# Patient Record
Sex: Female | Born: 1975 | Race: White | Hispanic: No | Marital: Married | Smoking: Never smoker
Health system: Southern US, Community
[De-identification: ages and names within clinical notes are randomized; demographics above are authoritative.]

## PROBLEM LIST (undated history)

## (undated) DIAGNOSIS — F419 Anxiety disorder, unspecified: Secondary | ICD-10-CM

## (undated) DIAGNOSIS — K52832 Lymphocytic colitis: Secondary | ICD-10-CM

## (undated) HISTORY — DX: Lymphocytic colitis: K52.832

## (undated) HISTORY — PX: ANKLE FRACTURE SURGERY: SHX122

## (undated) HISTORY — DX: Anxiety disorder, unspecified: F41.9

---

## 1998-07-29 ENCOUNTER — Other Ambulatory Visit: Admission: RE | Admit: 1998-07-29 | Discharge: 1998-07-29 | Payer: Self-pay | Admitting: Obstetrics and Gynecology

## 1999-03-28 ENCOUNTER — Emergency Department (HOSPITAL_COMMUNITY): Admission: EM | Admit: 1999-03-28 | Discharge: 1999-03-28 | Payer: Self-pay

## 1999-08-18 ENCOUNTER — Other Ambulatory Visit: Admission: RE | Admit: 1999-08-18 | Discharge: 1999-08-18 | Payer: Self-pay | Admitting: Obstetrics and Gynecology

## 1999-11-01 ENCOUNTER — Emergency Department (HOSPITAL_COMMUNITY): Admission: EM | Admit: 1999-11-01 | Discharge: 1999-11-01 | Payer: Self-pay

## 2000-08-26 ENCOUNTER — Other Ambulatory Visit: Admission: RE | Admit: 2000-08-26 | Discharge: 2000-08-26 | Payer: Self-pay | Admitting: Obstetrics and Gynecology

## 2001-09-20 ENCOUNTER — Other Ambulatory Visit: Admission: RE | Admit: 2001-09-20 | Discharge: 2001-09-20 | Payer: Self-pay | Admitting: Obstetrics and Gynecology

## 2002-01-23 ENCOUNTER — Other Ambulatory Visit: Admission: RE | Admit: 2002-01-23 | Discharge: 2002-01-23 | Payer: Self-pay | Admitting: Obstetrics and Gynecology

## 2002-06-23 ENCOUNTER — Emergency Department (HOSPITAL_COMMUNITY): Admission: EM | Admit: 2002-06-23 | Discharge: 2002-06-23 | Payer: Self-pay | Admitting: Emergency Medicine

## 2002-07-10 ENCOUNTER — Other Ambulatory Visit: Admission: RE | Admit: 2002-07-10 | Discharge: 2002-07-10 | Payer: Self-pay | Admitting: Obstetrics and Gynecology

## 2003-08-08 ENCOUNTER — Other Ambulatory Visit: Admission: RE | Admit: 2003-08-08 | Discharge: 2003-08-08 | Payer: Self-pay | Admitting: Obstetrics and Gynecology

## 2004-08-12 ENCOUNTER — Other Ambulatory Visit: Admission: RE | Admit: 2004-08-12 | Discharge: 2004-08-12 | Payer: Self-pay | Admitting: Obstetrics and Gynecology

## 2005-01-21 ENCOUNTER — Emergency Department (HOSPITAL_COMMUNITY): Admission: EM | Admit: 2005-01-21 | Discharge: 2005-01-21 | Payer: Self-pay | Admitting: Emergency Medicine

## 2005-02-08 ENCOUNTER — Emergency Department (HOSPITAL_COMMUNITY): Admission: EM | Admit: 2005-02-08 | Discharge: 2005-02-08 | Payer: Self-pay | Admitting: Emergency Medicine

## 2005-08-19 ENCOUNTER — Ambulatory Visit: Payer: Self-pay | Admitting: Gastroenterology

## 2005-10-19 ENCOUNTER — Ambulatory Visit: Payer: Self-pay | Admitting: Gastroenterology

## 2005-11-01 ENCOUNTER — Ambulatory Visit: Payer: Self-pay | Admitting: Gastroenterology

## 2005-11-01 ENCOUNTER — Encounter (INDEPENDENT_AMBULATORY_CARE_PROVIDER_SITE_OTHER): Payer: Self-pay | Admitting: Specialist

## 2006-01-11 ENCOUNTER — Ambulatory Visit: Payer: Self-pay | Admitting: Gastroenterology

## 2006-11-22 ENCOUNTER — Ambulatory Visit: Payer: Self-pay | Admitting: Family Medicine

## 2006-11-22 LAB — CONVERTED CEMR LAB
ALT: 13 units/L (ref 0–40)
AST: 23 units/L (ref 0–37)
Albumin: 4.4 g/dL (ref 3.5–5.2)
Alkaline Phosphatase: 57 units/L (ref 39–117)
BUN: 9 mg/dL (ref 6–23)
Basophils Absolute: 0 10*3/uL (ref 0.0–0.1)
Basophils Relative: 0.3 % (ref 0.0–1.0)
Bilirubin, Direct: 0.1 mg/dL (ref 0.0–0.3)
CO2: 25 meq/L (ref 19–32)
Calcium: 9.6 mg/dL (ref 8.4–10.5)
Chloride: 106 meq/L (ref 96–112)
Cholesterol: 168 mg/dL (ref 0–200)
Creatinine, Ser: 0.8 mg/dL (ref 0.4–1.2)
Eosinophils Absolute: 0.1 10*3/uL (ref 0.0–0.6)
Eosinophils Relative: 1.3 % (ref 0.0–5.0)
GFR calc Af Amer: 109 mL/min
GFR calc non Af Amer: 90 mL/min
Glucose, Bld: 75 mg/dL (ref 70–99)
HCT: 41.5 % (ref 36.0–46.0)
HDL: 58.3 mg/dL (ref >39.0)
Hemoglobin: 14.4 g/dL (ref 12.0–15.0)
LDL Cholesterol: 101 mg/dL — ABNORMAL HIGH (ref 0–99)
Lymphocytes Relative: 24.6 % (ref 12.0–46.0)
MCHC: 34.7 g/dL (ref 30.0–36.0)
MCV: 91 fL (ref 78.0–100.0)
Monocytes Absolute: 0.4 10*3/uL (ref 0.2–0.7)
Monocytes Relative: 5.5 % (ref 3.0–11.0)
Neutro Abs: 4.7 10*3/uL (ref 1.4–7.7)
Neutrophils Relative %: 68.3 % (ref 43.0–77.0)
Platelets: 271 10*3/uL (ref 150–400)
Potassium: 4.7 meq/L (ref 3.5–5.1)
RBC: 4.56 M/uL (ref 3.87–5.11)
RDW: 13 % (ref 11.5–14.6)
Sodium: 138 meq/L (ref 135–145)
TSH: 1.06 microintl units/mL (ref 0.35–5.50)
Total Bilirubin: 0.8 mg/dL (ref 0.3–1.2)
Total CHOL/HDL Ratio: 2.9
Total Protein: 6.9 g/dL (ref 6.0–8.3)
Triglycerides: 43 mg/dL (ref 0–149)
VLDL: 9 mg/dL (ref 0–40)
WBC: 6.9 10*3/uL (ref 4.5–10.5)

## 2007-01-02 ENCOUNTER — Telehealth (INDEPENDENT_AMBULATORY_CARE_PROVIDER_SITE_OTHER): Payer: Self-pay | Admitting: *Deleted

## 2007-01-31 ENCOUNTER — Emergency Department (HOSPITAL_COMMUNITY): Admission: EM | Admit: 2007-01-31 | Discharge: 2007-01-31 | Payer: Self-pay | Admitting: Emergency Medicine

## 2008-04-15 ENCOUNTER — Ambulatory Visit: Payer: Self-pay | Admitting: Family Medicine

## 2008-04-15 LAB — CONVERTED CEMR LAB
Inflenza A Ag: NEGATIVE
Influenza B Ag: NEGATIVE
Rapid Strep: NEGATIVE

## 2008-04-22 ENCOUNTER — Telehealth (INDEPENDENT_AMBULATORY_CARE_PROVIDER_SITE_OTHER): Payer: Self-pay | Admitting: *Deleted

## 2008-10-04 ENCOUNTER — Emergency Department (HOSPITAL_COMMUNITY): Admission: EM | Admit: 2008-10-04 | Discharge: 2008-10-04 | Payer: Self-pay | Admitting: Neurology

## 2009-07-20 ENCOUNTER — Emergency Department (HOSPITAL_COMMUNITY): Admission: EM | Admit: 2009-07-20 | Discharge: 2009-07-20 | Payer: Self-pay | Admitting: Emergency Medicine

## 2009-10-28 LAB — CONVERTED CEMR LAB: Pap Smear: NORMAL

## 2009-11-20 ENCOUNTER — Ambulatory Visit: Payer: Self-pay | Admitting: Family Medicine

## 2009-11-20 ENCOUNTER — Encounter (INDEPENDENT_AMBULATORY_CARE_PROVIDER_SITE_OTHER): Payer: Self-pay | Admitting: *Deleted

## 2009-11-20 DIAGNOSIS — D239 Other benign neoplasm of skin, unspecified: Secondary | ICD-10-CM | POA: Insufficient documentation

## 2009-11-21 LAB — CONVERTED CEMR LAB
ALT: 9 units/L (ref 0–35)
AST: 20 units/L (ref 0–37)
Albumin: 4.3 g/dL (ref 3.5–5.2)
Alkaline Phosphatase: 51 units/L (ref 39–117)
BUN: 11 mg/dL (ref 6–23)
Basophils Absolute: 0 10*3/uL (ref 0.0–0.1)
Basophils Relative: 0.6 % (ref 0.0–3.0)
Bilirubin, Direct: 0.1 mg/dL (ref 0.0–0.3)
CO2: 28 meq/L (ref 19–32)
Calcium: 9.3 mg/dL (ref 8.4–10.5)
Chloride: 106 meq/L (ref 96–112)
Cholesterol: 161 mg/dL (ref 0–200)
Creatinine, Ser: 0.6 mg/dL (ref 0.4–1.2)
Eosinophils Absolute: 0.2 10*3/uL (ref 0.0–0.7)
Eosinophils Relative: 3.9 % (ref 0.0–5.0)
GFR calc non Af Amer: 113.97 mL/min (ref >60)
Glucose, Bld: 71 mg/dL (ref 70–99)
HCT: 39.4 % (ref 36.0–46.0)
HDL: 68.4 mg/dL (ref >39.00)
Hemoglobin: 13.5 g/dL (ref 12.0–15.0)
LDL Cholesterol: 85 mg/dL (ref 0–99)
Lymphocytes Relative: 26.8 % (ref 12.0–46.0)
Lymphs Abs: 1.7 10*3/uL (ref 0.7–4.0)
MCHC: 34.3 g/dL (ref 30.0–36.0)
MCV: 93.3 fL (ref 78.0–100.0)
Monocytes Absolute: 0.6 10*3/uL (ref 0.1–1.0)
Monocytes Relative: 8.6 % (ref 3.0–12.0)
Neutro Abs: 3.9 10*3/uL (ref 1.4–7.7)
Neutrophils Relative %: 60.1 % (ref 43.0–77.0)
Platelets: 296 10*3/uL (ref 150.0–400.0)
Potassium: 4.1 meq/L (ref 3.5–5.1)
RBC: 4.22 M/uL (ref 3.87–5.11)
RDW: 13.8 % (ref 11.5–14.6)
Sodium: 140 meq/L (ref 135–145)
TSH: 0.85 microintl units/mL (ref 0.35–5.50)
Total Bilirubin: 0.5 mg/dL (ref 0.3–1.2)
Total CHOL/HDL Ratio: 2
Total Protein: 6.7 g/dL (ref 6.0–8.3)
Triglycerides: 37 mg/dL (ref 0.0–149.0)
VLDL: 7.4 mg/dL (ref 0.0–40.0)
WBC: 6.4 10*3/uL (ref 4.5–10.5)

## 2010-02-10 ENCOUNTER — Ambulatory Visit: Payer: Self-pay | Admitting: Family Medicine

## 2010-02-10 ENCOUNTER — Telehealth (INDEPENDENT_AMBULATORY_CARE_PROVIDER_SITE_OTHER): Payer: Self-pay | Admitting: *Deleted

## 2010-02-10 ENCOUNTER — Ambulatory Visit: Payer: Self-pay | Admitting: Cardiology

## 2010-02-10 DIAGNOSIS — R51 Headache: Secondary | ICD-10-CM

## 2010-02-10 DIAGNOSIS — R519 Headache, unspecified: Secondary | ICD-10-CM | POA: Insufficient documentation

## 2010-07-19 ENCOUNTER — Encounter: Payer: Self-pay | Admitting: Obstetrics and Gynecology

## 2010-07-28 NOTE — Assessment & Plan Note (Signed)
Summary: CPX & LAB/CBS   Vital Signs:  Patient profile:   35 year old female Height:      67 inches Weight:      159 pounds BMI:     24.99 Pulse rate:   78 / minute Pulse rhythm:   regular BP sitting:   112 / 72  (left arm) Cuff size:   regular  Vitals Entered By: Army Fossa CMA (Nov 20, 2009 9:09 AM) CC: Pt here for CPX, pt is fasting, no pap   History of Present Illness: Pt here for cpe and labs.   Pt sees Dr Dareen Piano for gyn exam.  No complaints.    Preventive Screening-Counseling & Management  Alcohol-Tobacco     Alcohol drinks/day: 0     Smoking Status: quit > 6 months     Smoke Cessation Stage: quit     Packs/Day: 1.0     Year Started: 1999     Year Quit: 2002  Caffeine-Diet-Exercise     Caffeine use/day: 2     Does Patient Exercise: yes     Type of exercise: bike, treadmill, weights     Exercise (avg: min/session): 30-60     Times/week: 3     Exercise Counseling: not indicated; exercise is adequate  Hep-HIV-STD-Contraception     Dental Visit-last 6 months yes     Dental Care Counseling: not indicated; dental care within six months     SBE monthly: yes     SBE Education/Counseling: not indicated; SBE done regularly      Sexual History:  currently monogamous and married.        Drug Use:  no.    Current Medications (verified): 1)  None  Allergies: 1)  ! Sulfa  Past History:  Past Medical History: Anxiety lymphocytic colitis--patterson  Past Surgical History: Denies surgical history  Family History: Reviewed history from 11/23/2006 and no changes required. Family History of Arthritis Family History Diabetes 1st degree relative Family History Hypertension Family History Lung cancer puncle--prostate ca Family History of Cervical cancer-- Maunt  Social History: Reviewed history from 11/23/2006 and no changes required. 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 Eyes:  Denies blurring, discharge, double vision, eye irritation, eye pain, halos, itching, light sensitivity, red eye, vision loss-1 eye, and vision loss-both eyes; optho-q1y--contacts. ENT:  Denies decreased hearing, difficulty swallowing, ear discharge, earache, hoarseness, nasal congestion, nosebleeds, postnasal drainage, ringing in ears, sinus pressure, and sore throat. CV:  Denies bluish discoloration of lips or nails, chest pain or discomfort, difficulty breathing at night, difficulty breathing while lying down, fainting, fatigue, leg cramps with exertion, lightheadness, near fainting, palpitations, shortness of breath with exertion, swelling of feet, swelling of hands, and weight gain. Resp:  Denies chest discomfort, chest pain with inspiration, cough, coughing up blood, excessive snoring, hypersomnolence, morning headaches, pleuritic, shortness of breath, sputum productive, and wheezing. GI:  Denies abdominal pain, bloody stools, change in bowel habits, constipation, dark tarry stools, diarrhea, excessive appetite, gas, hemorrhoids, indigestion, loss of appetite, nausea, vomiting, vomiting blood, and yellowish skin  color. GU:  Denies abnormal vaginal bleeding, decreased libido, discharge, dysuria, genital sores, hematuria, incontinence, nocturia, urinary frequency, and urinary hesitancy. MS:  Denies joint pain, joint redness, joint swelling, loss of strength, low back pain, mid back pain, muscle aches, muscle , cramps, muscle weakness, stiffness, and thoracic pain. Derm:  Denies changes in color of skin,  changes in nail beds, dryness, excessive perspiration, flushing, hair loss, insect bite(s), itching, lesion(s), poor wound healing, and rash. Neuro:  Denies brief paralysis, difficulty with concentration, disturbances in coordination, falling down, headaches, inability to speak, memory loss, numbness, poor balance, seizures, sensation of room spinning, tingling, tremors, visual disturbances, and weakness. Psych:  Denies alternate hallucination ( auditory/visual), anxiety, depression, easily angered, easily tearful, irritability, mental problems, panic attacks, sense of great danger, suicidal thoughts/plans, thoughts of violence, unusual visions or sounds, and thoughts /plans of harming others. Endo:  Denies cold intolerance, excessive hunger, excessive thirst, excessive urination, heat intolerance, polyuria, and weight change. Heme:  Denies abnormal bruising, bleeding, enlarge lymph nodes, fevers, pallor, and skin discoloration. Allergy:  Denies hives or rash, itching eyes, persistent infections, seasonal allergies, and sneezing.  Physical Exam  General:  Well-developed,well-nourished,in no acute distress; alert,appropriate and cooperative throughout examination Head:  Normocephalic and atraumatic without obvious abnormalities. No apparent alopecia or balding. Eyes:  vision grossly intact, pupils equal, pupils round, pupils reactive to light, and no injection.   Ears:  External ear exam shows no significant lesions or deformities.  Otoscopic examination reveals clear canals, tympanic membranes are intact bilaterally without bulging, retraction, inflammation or discharge. Hearing is grossly normal bilaterally. Nose:  External nasal examination shows no deformity or inflammation. Nasal mucosa are pink and moist without lesions or exudates. Mouth:  Oral mucosa and oropharynx without lesions or exudates.  Teeth in good repair. Neck:  No deformities, masses, or tenderness noted. Chest Wall:  No  deformities, masses, or tenderness noted. Breasts:  gyn Lungs:  Normal respiratory effort, chest expands symmetrically. Lungs are clear to auscultation, no crackles or wheezes. Heart:  normal rate and no murmur.   Abdomen:  Bowel sounds positive,abdomen soft and non-tender without masses, organomegaly or hernias noted. Genitalia:  gyn Msk:  normal ROM, no joint tenderness, no joint swelling, no joint warmth, no redness over joints, no joint deformities, no joint instability, and no crepitation.   Pulses:  R posterior tibial normal, R dorsalis pedis normal, L posterior tibial normal, and L dorsalis pedis normal.   Extremities:  No clubbing, cyanosis, edema, or deformity noted with normal full range of motion of all joints.   Neurologic:  No cranial nerve deficits noted. Station and gait are normal. Plantar reflexes are down-going bilaterally. DTRs are symmetrical throughout. Sensory, motor and coordinative functions appear intact. Skin:  multiple moles Cervical Nodes:  No lymphadenopathy noted Psych:  Oriented X3 and normally interactive.     Impression & Recommendations:  Problem # 1:  PREVENTIVE HEALTH CARE (ICD-V70.0) GHM utd   Orders: Venipuncture (47829) TLB-Lipid Panel (80061-LIPID) TLB-BMP (Basic Metabolic Panel-BMET) (80048-METABOL) TLB-CBC Platelet - w/Differential (85025-CBCD) TLB-Hepatic/Liver Function Pnl (80076-HEPATIC) TLB-TSH (Thyroid Stimulating Hormone) (84443-TSH)  Problem # 2:  MOLE (ICD-216.9)  Orders: Dermatology Referral (Derma)  Complete Medication List: 1)  Probiotics   PAP Result Date:  10/28/2009 PAP Result:  normal PAP Next Due:  1 yr   Immunization History:  Tetanus/Td Immunization History:    Tetanus/Td:  Tdap (11/09/2006)   Appended Document: CPX & LAB/CBS  Laboratory Results   Urine Tests   Date/Time Reported: Nov 20, 2009 11:25 AM   Routine Urinalysis   Color: yellow Appearance: Clear Glucose: negative   (Normal Range:  Negative) Bilirubin: negative   (Normal Range: Negative) Ketone: negative   (Normal Range: Negative) Spec. Gravity: <1.005   (Normal Range: 1.003-1.035) Blood: negative   (Normal Range: Negative) pH: 6.5   (Normal Range: 5.0-8.0) Protein: negative   (Normal Range: Negative) Urobilinogen: negative   (Normal Range: 0-1) Nitrite: negative   (Normal Range: Negative) Leukocyte Esterace: negative   (Normal Range: Negative)    Comments: Floydene Flock  Nov 20, 2009 11:25 AM

## 2010-07-28 NOTE — Progress Notes (Signed)
Summary:   -CT-RESULT  Phone Note From Other Clinic   Summary of Call: PT C/O FACIAL PAIN AND PRESSURE. PT USES [facility] ................Marland KitchenFelecia Deloach CMA  February 10, 2010 2:47 PM   Follow-up for Phone Call        augmentin 875 1 by mouth two times a day for 10 days  she can pick up steroid nasal spray or we can call in flonase 2 sprays each nostril once daily ov 2 weeks if no better or symptoms do not improve Follow-up by: Loreen Freud DO,  February 10, 2010 3:18 PM  Additional Follow-up for Phone Call Additional follow up Details #1::        left message to call  office...........Marland KitchenFelecia Deloach CMA  February 10, 2010 3:45 PM   pt aware.............Marland KitchenFelecia Deloach CMA  February 10, 2010 4:49 PM     New/Updated Medications: AUGMENTIN 875-125 MG TABS (AMOXICILLIN-POT CLAVULANATE) Take 1 by mouth two times a day for 10 days OMNARIS 50 MCG/ACT SUSP (CICLESONIDE) take 1 spray each nostril once daily Prescriptions: AUGMENTIN 875-125 MG TABS (AMOXICILLIN-POT CLAVULANATE) Take 1 by mouth two times a day for 10 days  #20 x 0   Entered by:   Jeremy Johann CMA   Authorized by:   Loreen Freud DO   Signed by:   Jeremy Johann CMA on 02/10/2010   Method used:   [facility]ed to ...       [facility] [address] [phone]* (retail)       [address]       [place], [place]  [address]       Ph: [phone] or [phone]       Fax: [phone]   RxID:   [id number]

## 2010-07-28 NOTE — Assessment & Plan Note (Signed)
Summary: headache/cbs   Vital Signs:  Patient profile:   35 year old female Height:      67 inches (170.18 cm) Weight:      157.25 pounds (71.48 kg) BMI:     24.72 Temp:     98.3 degrees F (36.83 degrees C) oral BP sitting:   120 / 72  (left arm) Cuff size:   large  Vitals Entered By: Lucious Groves CMA (February 10, 2010 11:20 AM) CC: C/O HA x9 days./kb Is Patient Diabetic? No Pain Assessment Patient in pain? yes     Location: headache Intensity: 7 Type: sharp Onset of pain  9 days ago--descent on flight Comments Patient notes that she does not have a hx of migraines, and her vision is not affected. Patient does note sensitivity to light and sound./kb   History of Present Illness: 35 yo woman here today for HA.  started 9 days ago while flying to [place].  on the descent pt developed a sharp pain from R inner eye that traveled across eyebrow.  had low grade HA for whole week.  on flight home had similar sharp pain but this time it radiated across both eyebrows.  no visual changes.  no weakness, numbness, slurred speech.  + sensitivity to light and sound.  + nausea.  took phenergan this AM w/ some relief but causes fatigue.  hx of tension HA but no hx of migraine.  dad had hx of migraine.  has flown previously w/out difficulty.  denies facial pain, pressure, fever, ear pain, nasal congestion, sick contacts.  Current Medications (verified): 1)  Probiotics  Allergies (verified): 1)  ! Sulfa  Past History:  Past Medical History: Last updated: 11/20/2009 Anxiety lymphocytic colitis--patterson  Review of Systems      See HPI  Physical Exam  General:  obviously uncomfortable, wearing sunglasses inside Head:  no TTP over frontal or maxillary sinuses Eyes:  no injxn or inflammation PERRL, EOMI Ears:  External ear exam shows no significant lesions or deformities.  Otoscopic examination reveals clear canals, tympanic membranes are intact bilaterally without bulging, retraction,  inflammation or discharge. Hearing is grossly normal bilaterally. Neck:  No deformities, masses, or tenderness noted. Lungs:  Normal respiratory effort, chest expands symmetrically. Lungs are clear to auscultation, no crackles or wheezes. Heart:  normal rate and no murmur.   Pulses:  +2 carotid, radial, DP Extremities:  No clubbing, cyanosis, edema, or deformity noted with normal full range of motion of all joints.   Neurologic:  No cranial nerve deficits noted. Station and gait are normal. Plantar reflexes are down-going bilaterally. DTRs are symmetrical throughout. Sensory, motor and coordinative functions appear intact.   Impression & Recommendations:  Problem # 1:  HEADACHE (ICD-784.0) Assessment New pt's sxs possibly migraine but pt w/out hx of migraine, denies sinusitis sxs.  check head CT as this HA is different from any previous.  samples of Treximet given, vicodin as needed.  reviewed supportive care and red flags that should prompt return.  Pt expresses understanding and is in agreement w/ this plan. Orders: Radiology Referral (Radiology)  Her updated medication list for this problem includes:    Vicodin 5-500 Mg Tabs (Hydrocodone-acetaminophen) .Marland Kitchen... 1-2 tabs q6 as needed for severe pain  Complete Medication List: 1)  Probiotics  2)  Vicodin 5-500 Mg Tabs (Hydrocodone-acetaminophen) .Marland Kitchen.. 1-2 tabs q6 as needed for severe pain 3)  Promethazine Hcl 25 Mg Tabs (Promethazine hcl) .Marland Kitchen.. 1 tab q6 as needed for nausea 4)  Augmentin 875-125  Mg Tabs (Amoxicillin-pot clavulanate) .... Take 1 by mouth two times a day for 10 days 5)  Omnaris 50 Mcg/act Susp (Ciclesonide) .... Take 1 spray each nostril once daily  Patient Instructions: 1)  Please go have your CT scan at 1pm at [facility]- arrive by 12:45. 2)  Take the Treximet now- may repeat in 2 hours if no improvement 3)  Use the Vicodin as needed for severe pain.  Don't take any additional tylenol w/ this. 4)  You may add  Ibuprofen or Aleve as needed. 5)  Use the Promethazine as needed for nausea 6)  Drink plenty of fluids 7)  Hang in there!! Prescriptions: PROMETHAZINE HCL 25 MG  TABS (PROMETHAZINE HCL) 1 tab Q6 as needed for nausea  #20 x 0   Entered and Authorized by:   Neena Rhymes MD   Signed by:   Neena Rhymes MD on 02/10/2010   Method used:   Print then Give to Patient   RxID:   [id number] VICODIN 5-500 MG TABS (HYDROCODONE-ACETAMINOPHEN) 1-2 tabs Q6 as needed for severe pain  #30 x 0   Entered and Authorized by:   Neena Rhymes MD   Signed by:   Neena Rhymes MD on 02/10/2010   Method used:   Print then Give to Patient   RxID:   [id number]

## 2010-07-28 NOTE — Letter (Signed)
Summary: Primary Care Consult Scheduled Letter  [facility]  [address] [place], [place] [address]   Phone: [phone]  Fax: [phone]      11/20/2009 MRN: [id number]  [facility] [address] [place], [place]  [address]    Dear Ms. Lori Bullock,    We have scheduled an appointment for you.  At the recommendation of Dr. Loreen Freud, we have scheduled you a consult with Dr. Betsy [facility] on 12-11-2009 at 3:30pm.  Their address is [address], [place] [address]. The office phone number is [phone].  If this appointment day and time is not convenient for you, please feel free to call the office of the doctor you are being referred to at the number listed above and reschedule the appointment.    It is important for you to keep your scheduled appointments. We are here to make sure you are given good patient care.   Thank you,    Renee, Patient Care Coordinator [facility]

## 2010-09-13 LAB — URINALYSIS, ROUTINE W REFLEX MICROSCOPIC
Glucose, UA: NEGATIVE mg/dL
Hgb urine dipstick: NEGATIVE
Specific Gravity, Urine: 1.017 (ref 1.005–1.030)
pH: 7 (ref 5.0–8.0)

## 2010-09-13 LAB — COMPREHENSIVE METABOLIC PANEL
AST: 25 U/L (ref 0–37)
Albumin: 3.9 g/dL (ref 3.5–5.2)
Chloride: 105 mEq/L (ref 96–112)
Creatinine, Ser: 0.75 mg/dL (ref 0.4–1.2)
GFR calc Af Amer: >60 mL/min (ref >60)
Total Bilirubin: 0.5 mg/dL (ref 0.3–1.2)
Total Protein: 6.6 g/dL (ref 6.0–8.3)

## 2010-09-13 LAB — CBC
HCT: 38.5 % (ref 36.0–46.0)
Hemoglobin: 13 g/dL (ref 12.0–15.0)
MCHC: 33.8 g/dL (ref 30.0–36.0)
MCV: 92.8 fL (ref 78.0–100.0)
Platelets: 302 10*3/uL (ref 150–400)
RBC: 4.15 MIL/uL (ref 3.87–5.11)
RDW: 13.5 % (ref 11.5–15.5)
WBC: 8.1 10*3/uL (ref 4.0–10.5)

## 2010-09-13 LAB — DIFFERENTIAL
Basophils Absolute: 0 10*3/uL (ref 0.0–0.1)
Eosinophils Relative: 0 % (ref 0–5)
Lymphocytes Relative: 14 % (ref 12–46)
Lymphs Abs: 1.1 10*3/uL (ref 0.7–4.0)
Monocytes Absolute: 0.3 10*3/uL (ref 0.1–1.0)
Monocytes Relative: 4 % (ref 3–12)

## 2010-10-07 LAB — URINALYSIS, ROUTINE W REFLEX MICROSCOPIC
Glucose, UA: NEGATIVE mg/dL
Nitrite: NEGATIVE
Specific Gravity, Urine: 1.014 (ref 1.005–1.030)
pH: 6.5 (ref 5.0–8.0)

## 2010-10-07 LAB — DIFFERENTIAL
Eosinophils Absolute: 0.1 10*3/uL (ref 0.0–0.7)
Eosinophils Relative: 1 % (ref 0–5)
Lymphocytes Relative: 14 % (ref 12–46)
Lymphs Abs: 1.1 10*3/uL (ref 0.7–4.0)
Monocytes Absolute: 0.4 10*3/uL (ref 0.1–1.0)

## 2010-10-07 LAB — COMPREHENSIVE METABOLIC PANEL
ALT: 15 U/L (ref 0–35)
AST: 22 U/L (ref 0–37)
Albumin: 3.9 g/dL (ref 3.5–5.2)
CO2: 24 mEq/L (ref 19–32)
Calcium: 9.3 mg/dL (ref 8.4–10.5)
Chloride: 109 mEq/L (ref 96–112)
Creatinine, Ser: 0.77 mg/dL (ref 0.4–1.2)
GFR calc Af Amer: >60 mL/min (ref >60)
GFR calc non Af Amer: >60 mL/min (ref >60)
Sodium: 140 mEq/L (ref 135–145)

## 2010-10-07 LAB — CBC
MCV: 91.4 fL (ref 78.0–100.0)
Platelets: 246 10*3/uL (ref 150–400)
RBC: 4.28 MIL/uL (ref 3.87–5.11)
WBC: 8.1 10*3/uL (ref 4.0–10.5)

## 2010-10-07 LAB — POCT PREGNANCY, URINE: Preg Test, Ur: NEGATIVE

## 2010-10-24 ENCOUNTER — Inpatient Hospital Stay (HOSPITAL_COMMUNITY)
Admission: AD | Admit: 2010-10-24 | Discharge: 2010-10-27 | DRG: 774 | Disposition: A | Discharge status: Home / Self Care | Payer: PRIVATE HEALTH INSURANCE | Source: Ambulatory Visit | Attending: Obstetrics and Gynecology | Admitting: Obstetrics and Gynecology

## 2010-10-24 DIAGNOSIS — O4590 Premature separation of placenta, unspecified, unspecified trimester: Principal | ICD-10-CM | POA: Diagnosis present

## 2010-10-25 ENCOUNTER — Other Ambulatory Visit: Payer: Self-pay | Admitting: Obstetrics and Gynecology

## 2010-10-25 LAB — CBC
HCT: 34 % — ABNORMAL LOW (ref 36.0–46.0)
Hemoglobin: 11.4 g/dL — ABNORMAL LOW (ref 12.0–15.0)
MCH: 30 pg (ref 26.0–34.0)
MCHC: 33.5 g/dL (ref 30.0–36.0)
MCV: 89.5 fL (ref 78.0–100.0)
RDW: 14.6 % (ref 11.5–15.5)

## 2010-10-26 LAB — CBC
HCT: 30.7 % — ABNORMAL LOW (ref 36.0–46.0)
MCH: 30 pg (ref 26.0–34.0)
MCHC: 33.2 g/dL (ref 30.0–36.0)
MCV: 90.3 fL (ref 78.0–100.0)
Platelets: 184 10*3/uL (ref 150–400)
RDW: 15.1 % (ref 11.5–15.5)

## 2010-10-29 ENCOUNTER — Inpatient Hospital Stay (HOSPITAL_COMMUNITY): Admission: AD | Admit: 2010-10-29 | Payer: Self-pay | Source: Home / Self Care | Admitting: Obstetrics and Gynecology

## 2010-11-13 NOTE — Assessment & Plan Note (Signed)
[facility]                           GASTROENTEROLOGY OFFICE NOTE   NAME:WHISENANTBeckey, Lori Bullock                   MRN:          [id number]  DATE:01/11/2006                            DOB:          Mar 19, 1976    Lori Bullock has been doing well on Entocort 9 mg a day.  She ran out of her  medications, had a recurrence of her diarrhea.  She on close questioning  does take a large amount of NSAIDs for headaches and joint pains and I have  asked her to discontinue use of NSAIDs which have a definite relationship to  her lymphocytic colitis.  I will try to taper her off of her Entocort and do  6 mg a day for 2 weeks, then 3 mg a day for 2 weeks and hopefully she will  be able to discontinue this medication once she is off of NSAIDs.  I have  also offered her an opportunity to go to the headache clinic today, which  she has declined.  Her workup otherwise has been unremarkable, including  celiac antibody titers.  Her other medications at this time are Lexapro 10  mg a day.                                   Vania Rea. Jarold Motto, MD, Clementeen Graham, [place]   DRP/MedQ  DD:  01/11/2006  DT:  01/11/2006  Job #:  [id number]

## 2010-11-24 ENCOUNTER — Other Ambulatory Visit: Payer: Self-pay | Admitting: Obstetrics and Gynecology

## 2011-01-04 ENCOUNTER — Emergency Department (HOSPITAL_COMMUNITY): Payer: PRIVATE HEALTH INSURANCE

## 2011-01-04 ENCOUNTER — Emergency Department (HOSPITAL_COMMUNITY)
Admission: EM | Admit: 2011-01-04 | Discharge: 2011-01-04 | Disposition: A | Discharge status: Home / Self Care | Payer: PRIVATE HEALTH INSURANCE | Attending: Emergency Medicine | Admitting: Emergency Medicine

## 2011-01-04 ENCOUNTER — Encounter (HOSPITAL_COMMUNITY): Payer: Self-pay

## 2011-01-04 DIAGNOSIS — R42 Dizziness and giddiness: Secondary | ICD-10-CM | POA: Insufficient documentation

## 2011-01-04 DIAGNOSIS — M545 Low back pain, unspecified: Secondary | ICD-10-CM | POA: Insufficient documentation

## 2011-01-04 DIAGNOSIS — R51 Headache: Secondary | ICD-10-CM | POA: Insufficient documentation

## 2011-01-04 DIAGNOSIS — R509 Fever, unspecified: Secondary | ICD-10-CM | POA: Insufficient documentation

## 2011-01-04 DIAGNOSIS — IMO0001 Reserved for inherently not codable concepts without codable children: Secondary | ICD-10-CM | POA: Insufficient documentation

## 2011-01-04 DIAGNOSIS — N61 Mastitis without abscess: Secondary | ICD-10-CM | POA: Insufficient documentation

## 2011-01-04 LAB — CBC
HCT: 37.7 % (ref 36.0–46.0)
Hemoglobin: 12.6 g/dL (ref 12.0–15.0)
MCHC: 33.4 g/dL (ref 30.0–36.0)
MCV: 87.9 fL (ref 78.0–100.0)

## 2011-01-04 LAB — BASIC METABOLIC PANEL
BUN: 16 mg/dL (ref 6–23)
Chloride: 105 mEq/L (ref 96–112)
Creatinine, Ser: 0.79 mg/dL (ref 0.50–1.10)
GFR calc non Af Amer: >60 mL/min (ref >60)
Glucose, Bld: 114 mg/dL — ABNORMAL HIGH (ref 70–99)
Potassium: 3.7 mEq/L (ref 3.5–5.1)

## 2011-01-04 LAB — URINALYSIS, ROUTINE W REFLEX MICROSCOPIC
Bilirubin Urine: NEGATIVE
Hgb urine dipstick: NEGATIVE
Ketones, ur: NEGATIVE mg/dL
Protein, ur: NEGATIVE mg/dL
Specific Gravity, Urine: 1.025 (ref 1.005–1.030)
Urobilinogen, UA: 0.2 mg/dL (ref 0.0–1.0)

## 2011-01-04 LAB — DIFFERENTIAL
Basophils Absolute: 0 10*3/uL (ref 0.0–0.1)
Lymphocytes Relative: 8 % — ABNORMAL LOW (ref 12–46)
Lymphs Abs: 1.4 10*3/uL (ref 0.7–4.0)
Monocytes Absolute: 1 10*3/uL (ref 0.1–1.0)
Neutro Abs: 16.4 10*3/uL — ABNORMAL HIGH (ref 1.7–7.7)

## 2011-01-04 LAB — URINE MICROSCOPIC-ADD ON

## 2011-02-02 ENCOUNTER — Ambulatory Visit (HOSPITAL_BASED_OUTPATIENT_CLINIC_OR_DEPARTMENT_OTHER)
Admission: RE | Admit: 2011-02-02 | Discharge: 2011-02-02 | Disposition: A | Discharge status: Home / Self Care | Payer: PRIVATE HEALTH INSURANCE | Source: Ambulatory Visit | Attending: Family Medicine | Admitting: Family Medicine

## 2011-02-02 ENCOUNTER — Encounter: Payer: Self-pay | Admitting: Family Medicine

## 2011-02-02 ENCOUNTER — Ambulatory Visit (INDEPENDENT_AMBULATORY_CARE_PROVIDER_SITE_OTHER): Payer: PRIVATE HEALTH INSURANCE | Admitting: Family Medicine

## 2011-02-02 VITALS — BP 116/68 | HR 69 | Temp 98.1°F | Wt 190.8 lb

## 2011-02-02 DIAGNOSIS — M25532 Pain in left wrist: Secondary | ICD-10-CM

## 2011-02-02 DIAGNOSIS — M25549 Pain in joints of unspecified hand: Secondary | ICD-10-CM

## 2011-02-02 DIAGNOSIS — M25539 Pain in unspecified wrist: Secondary | ICD-10-CM

## 2011-02-02 DIAGNOSIS — M2550 Pain in unspecified joint: Secondary | ICD-10-CM

## 2011-02-02 DIAGNOSIS — D229 Melanocytic nevi, unspecified: Secondary | ICD-10-CM

## 2011-02-02 DIAGNOSIS — D239 Other benign neoplasm of skin, unspecified: Secondary | ICD-10-CM

## 2011-02-02 NOTE — Assessment & Plan Note (Signed)
Check labs---pt worried because arthritis runs in family Ok to take Nsaids

## 2011-02-02 NOTE — Assessment & Plan Note (Signed)
Referral to derm  

## 2011-02-02 NOTE — Patient Instructions (Signed)
Wrist Pain (Sprain-Strain) Wrist injuries are frequent in adults and children. A sprain is an injury to the ligaments that hold your bones together. A strain is an injury to muscle or muscle cord like structures (tendons) from stretching or pulling. Generally, when wrists are moderately tender to touch following a fall or injury, a break in the bone (fracture) may often be present. HOME CARE INSTRUCTIONS  Apply ice to the injury for 15 minutes each hour while awake for the first 2 days. Put the ice in a plastic bag and place a towel between the bag of ice and your skin.   Keep your arm raised above the level of your heart whenever possible to reduce swelling and pain.   Avoid using your injured extremity as long as pain is still present.   If a splint or ace bandage has been applied, use it for several weeks or until seen by a caregiver for a follow-up examination.   Only take over-the-counter or prescription medicines for pain, discomfort, or fever as directed by your caregiver.  SEEK MEDICAL CARE IF:  You have increased pain or swelling.   Your fingers are colder than those on your other hand.  SEEK IMMEDIATE MEDICAL CARE IF:  Your fingers are swollen very red, white, and cold or blue.   Your fingers are numb or tingling.   You have increasing pain or difficulty moving your fingers.  Remember the importance of follow-up and possible follow-up x-rays. Improvement in pain level is not a guarantee that you did not fracture a bone in your wrist. The only way to determine whether or not you have broken a bone is by x-ray. MAKE SURE YOU:   Understand these instructions.   Will watch your condition.   Will get help right away if you are not doing well or get worse.  Document Released: 03/24/2005 Document Re-Released: 05/27/2008 [facility] Patient Information 2011 [place], [place].

## 2011-02-02 NOTE — Progress Notes (Signed)
  Subjective:    Patient ID: Lori Bullock, female    DOB: 03-28-1976, 35 y.o.   MRN: [id number]  HPI Pt here to f/u.  She had a baby boy 3 months ago.  She injured her L wrist just before giving birth.  She felt a pop while doing something at home and it has hurt since.    She also needs new referral to derm for mole.  She never went because she was pregnant.    Review of Systems As above    Objective:   Physical Exam  Constitutional: She is oriented to person, place, and time. She appears well-developed and well-nourished.  Cardiovascular: Normal rate, regular rhythm and normal heart sounds.   No murmur heard. Pulmonary/Chest: Effort normal and breath sounds normal. No respiratory distress. She has no wheezes. She has no rales.  Musculoskeletal: She exhibits tenderness. She exhibits no edema.       Pain L wrist at base thenar emminence No swelling No errythema No numbness in fingers  Neurological: She is alert and oriented to person, place, and time.  Psychiatric: She has a normal mood and affect. Her behavior is normal.          Assessment & Plan:

## 2011-02-02 NOTE — Assessment & Plan Note (Signed)
Thumb spica splint, ice, nsaids, rest To ortho if no better

## 2011-02-03 LAB — CBC WITH DIFFERENTIAL/PLATELET
Basophils Absolute: 0 10*3/uL (ref 0.0–0.1)
Eosinophils Absolute: 0.3 10*3/uL (ref 0.0–0.7)
HCT: 37.5 % (ref 36.0–46.0)
Hemoglobin: 12.5 g/dL (ref 12.0–15.0)
Lymphs Abs: 2 10*3/uL (ref 0.7–4.0)
MCHC: 33.2 g/dL (ref 30.0–36.0)
Neutro Abs: 3.9 10*3/uL (ref 1.4–7.7)
RDW: 13.5 % (ref 11.5–14.6)

## 2011-02-03 LAB — BASIC METABOLIC PANEL
CO2: 27 mEq/L (ref 19–32)
Calcium: 9.5 mg/dL (ref 8.4–10.5)
Glucose, Bld: 75 mg/dL (ref 70–99)
Potassium: 4.7 mEq/L (ref 3.5–5.1)
Sodium: 143 mEq/L (ref 135–145)

## 2011-02-03 LAB — TSH: TSH: 0.3 u[IU]/mL — ABNORMAL LOW (ref 0.35–5.50)

## 2011-02-03 LAB — HEPATIC FUNCTION PANEL: Albumin: 4.2 g/dL (ref 3.5–5.2)

## 2011-02-04 LAB — VITAMIN D 1,25 DIHYDROXY
Vitamin D2 1, 25 (OH)2: <8 pg/mL
Vitamin D3 1, 25 (OH)2: 70 pg/mL

## 2011-02-08 ENCOUNTER — Telehealth: Payer: Self-pay | Admitting: *Deleted

## 2011-02-08 NOTE — Telephone Encounter (Signed)
+   hyperthyroid---- recheck TSH, free T3, free T4 242.0--- if still abnormal refer to endo cbcd abnormal----288.8 cbcd  Left message to call back.

## 2011-02-09 NOTE — Telephone Encounter (Signed)
Pt returned call tried calling back left msg on machine to return call.

## 2011-02-09 NOTE — Telephone Encounter (Signed)
Spoke w/ pt aware of labs. However didn't inform pt about note of cbcd didn't see what was abnormal. Pls advise.

## 2011-02-09 NOTE — Telephone Encounter (Signed)
i'm not sure what happened but CBC is normal

## 2011-02-16 ENCOUNTER — Other Ambulatory Visit (INDEPENDENT_AMBULATORY_CARE_PROVIDER_SITE_OTHER): Payer: PRIVATE HEALTH INSURANCE

## 2011-02-16 DIAGNOSIS — E0500 Thyrotoxicosis with diffuse goiter without thyrotoxic crisis or storm: Secondary | ICD-10-CM

## 2011-02-16 LAB — T4, FREE: Free T4: 0.81 ng/dL (ref 0.60–1.60)

## 2011-02-16 NOTE — Progress Notes (Signed)
Labs only

## 2011-02-17 ENCOUNTER — Other Ambulatory Visit: Payer: PRIVATE HEALTH INSURANCE

## 2011-02-26 ENCOUNTER — Telehealth: Payer: Self-pay | Admitting: *Deleted

## 2011-02-26 NOTE — Telephone Encounter (Signed)
See result 

## 2011-02-26 NOTE — Telephone Encounter (Signed)
Pt left VM on triage that she would like to get result of her recent thyroid test.Please advise

## 2011-02-26 NOTE — Telephone Encounter (Signed)
Detailed message left advising Labs were normal      KP

## 2011-03-16 ENCOUNTER — Other Ambulatory Visit: Payer: PRIVATE HEALTH INSURANCE

## 2011-04-12 LAB — URINALYSIS, ROUTINE W REFLEX MICROSCOPIC
Bilirubin Urine: NEGATIVE
Glucose, UA: NEGATIVE
Hgb urine dipstick: NEGATIVE
Specific Gravity, Urine: 1.013

## 2011-10-05 ENCOUNTER — Ambulatory Visit (INDEPENDENT_AMBULATORY_CARE_PROVIDER_SITE_OTHER): Payer: BC Managed Care – PPO | Admitting: Family Medicine

## 2011-10-05 ENCOUNTER — Encounter: Payer: Self-pay | Admitting: Family Medicine

## 2011-10-05 VITALS — BP 118/72 | HR 110 | Temp 100.4°F | Wt 169.6 lb

## 2011-10-05 DIAGNOSIS — N61 Mastitis without abscess: Secondary | ICD-10-CM

## 2011-10-05 MED ORDER — CEPHALEXIN 500 MG PO CAPS: 500.0000 mg | ORAL_CAPSULE | Freq: Four times a day (QID) | ORAL | Status: DC

## 2011-10-05 NOTE — Progress Notes (Signed)
  Subjective:    Patient ID: Lori Bullock, female    DOB: [birth year], 36 y.o.   MRN: [id number]  HPI  Pt here c/o mastitis L breast.  This is her 3rd episode with this.  She is still breast feeding.  + fever 101  Review of Systems As above    Objective:   Physical Exam  Constitutional: She appears well-developed and well-nourished.  Genitourinary: There is breast swelling and tenderness. Pelvic exam was performed with patient supine.  Psychiatric: She has a normal mood and affect. Her behavior is normal. Judgment and thought content normal.  L breast-- + errythema  + hot to touch         Assessment & Plan:  Mastitis---L breast                 Keflex 500 mg 1 po qid for 10 days                 See avs                 Call if no better 3-4 days

## 2011-10-05 NOTE — Patient Instructions (Signed)
Mastitis   Mastitis is a bacterial infection of the breast tissue.  CAUSES   Bacteria causes infection by entering the breast tissue through cuts or openings in the skin. Typically, this occurs with breastfeeding due to cracked or irritated skin. It can be associated with plugged ducts. Nipple piercing can also lead to mastitis.  SYMPTOMS   In mastitis, an area of the breast becomes swollen, red, tender, and painful. You may notice you have a fever and swelling of the glands under your arm on that side. If the infection is allowed to progress, a collection of pus (abscess) may develop.  DIAGNOSIS   Your caregiver can diagnose mastitis based on your symptoms and upon examination. The diagnosis can be confirmed if pus can be expressed from the breast. This pus can be examined in the lab to determine which bacteria are present. If an abscess has developed, the fluid in the abscess can be removed with a needle. This is used to confirm the diagnosis and determine the bacteria present. In most cases, pus will not be present. Blood tests can be done to determine if your body is fighting a bacterial infection. Sometimes, a mammogram or ultrasound will be recommended to exclude other breast diseases including cancer.  Other rare forms of mastitis:   Tuberculosis mastitis is rare. The TB germ can affect the breast if it is present in some other part of the body. The breast may be slightly tender with a mass, but not tender or painful.   Syphilis of the nipple usually has an ulcer that is not tender.   Actinomycosis is a very rare bacterial infection of the breast that presents as a mass in the breast that is not tender or painful.   Phlebitis (inflammation of blood vessels) of the breast is an inflammation of the veins in the breast. It may be caused by tight fitting bras, surgery, or trauma to the breast.   Inflammatory carcinoma of the breast looks like mastitis because the breasts are red, swollen, or tender, but it  is a rare form of breast cancer.  TREATMENT   Antibiotic medication is used to treat the bacterial infection. Your caregiver will determine which bacteria are most likely to be causing the infection and select an antibiotic. This is sometimes changed based on the results of cultures, or if there is no response to the antibiotic selected. Antibiotics are usually given by mouth. If you are breastfeeding, it is important to continue to empty the breast. Your caregiver can tell you whether or not this milk is safe for your infant, or needs to be thrown away. Pain can usually be treated with medication.  HOME CARE INSTRUCTIONS    Take your antibiotics as directed. Finish them even if you start to feel better.   Only take over-the-counter or prescription medication for pain, discomfort, or fever as directed by your caregiver.   If breastfeeding, keep your nipples clean and dry. Your caregiver may tell you to stop nursing until he or she feels it is safe for your baby. Use a breast pump as instructed if forced to stop nursing.   Do not wear a tight bra. Wear a good support bra.   Empty the first breast completely before going to the other breast. If your baby is not emptying your breasts completely for some reason, use a breast pump to empty your breasts.   If you go back to work, pump your breasts while at work to stay in time   with your nursing schedule.   Increase your fluid intake especially if you have a fever.   Avoid having your breasts get overly filled with milk (engorged).  SEEK MEDICAL CARE IF:    You develop pus-like (purulent) discharge from the breast.   Your symptoms get worse.   You do not seem to be responding to your treatment within 2 days.  SEEK IMMEDIATE MEDICAL CARE IF:    You have a fever.   Your pain and swelling is getting worse.   You develop pain that is not controlled with medicine.   You develop a red line extending from the breast toward your armpit.  Document Released:  06/14/2005 Document Revised: 06/03/2011 Document Reviewed: 02/02/2008  [employer] Patient Information 2012 [employer].

## 2011-10-28 ENCOUNTER — Other Ambulatory Visit: Payer: Self-pay | Admitting: Family Medicine

## 2011-10-29 ENCOUNTER — Telehealth: Payer: Self-pay | Admitting: *Deleted

## 2011-10-29 DIAGNOSIS — N61 Mastitis without abscess: Secondary | ICD-10-CM

## 2011-10-29 MED ORDER — CEPHALEXIN 500 MG PO CAPS: 500.0000 mg | ORAL_CAPSULE | Freq: Two times a day (BID) | ORAL | Status: DC

## 2011-10-29 NOTE — Telephone Encounter (Signed)
Faxed and VM left making the patient aware.    KP 

## 2011-10-29 NOTE — Telephone Encounter (Signed)
Keflex  500 mg  1 po bid x 10 days

## 2011-10-29 NOTE — Telephone Encounter (Signed)
Pt states that she is having another episode of mastitis and would like to get a refill on the cephalexin (KEFLEX) 500 MG. Pt indicated that she was in here a few weeks ago and self Dx herself and spent 10 min in office and was charge $130. Pt would like to just get a refill on med. Pt notes that this should be the last time because she is planning to stop breast feeding..Please advise

## 2013-01-02 ENCOUNTER — Other Ambulatory Visit: Payer: Self-pay | Admitting: Obstetrics and Gynecology

## 2014-01-07 ENCOUNTER — Other Ambulatory Visit: Payer: Self-pay | Admitting: Obstetrics and Gynecology

## 2014-01-08 LAB — CYTOLOGY - PAP

## 2015-04-01 ENCOUNTER — Other Ambulatory Visit: Payer: Self-pay | Admitting: Obstetrics and Gynecology

## 2015-04-02 LAB — CYTOLOGY - PAP

## 2015-05-01 ENCOUNTER — Ambulatory Visit (INDEPENDENT_AMBULATORY_CARE_PROVIDER_SITE_OTHER): Payer: Self-pay | Admitting: Medical

## 2015-05-01 ENCOUNTER — Encounter: Payer: Self-pay | Admitting: Medical

## 2015-05-01 VITALS — BP 110/72 | HR 88 | Temp 99.7°F | Ht 67.0 in | Wt 147.0 lb

## 2015-05-01 DIAGNOSIS — R509 Fever, unspecified: Secondary | ICD-10-CM

## 2015-05-01 DIAGNOSIS — J020 Streptococcal pharyngitis: Secondary | ICD-10-CM

## 2015-05-01 DIAGNOSIS — M791 Myalgia: Secondary | ICD-10-CM

## 2015-05-01 DIAGNOSIS — M609 Myositis, unspecified: Secondary | ICD-10-CM

## 2015-05-01 DIAGNOSIS — IMO0001 Reserved for inherently not codable concepts without codable children: Secondary | ICD-10-CM

## 2015-05-01 LAB — POCT RAPID STREP A (OFFICE): Rapid Strep A Screen: POSITIVE — AB

## 2015-05-01 LAB — POCT INFLUENZA A/B: INFLUENZA A, POC: NEGATIVE

## 2015-05-01 MED ORDER — AZITHROMYCIN 250 MG PO TABS: ORAL_TABLET | ORAL | Status: DC

## 2015-05-01 NOTE — Progress Notes (Signed)
Subjective:    Patient ID: Lori Bullock, female    DOB: [birth year], 39 y.o.   MRN: [id number]  HPI   Pt in with st x 2 days. Hurts to swallow anything. Some fever, chills and sweats. Pt child not sick.  Headache  and some bodyaches.  Lmp- pt has mirena   Review of Systems  Constitutional: Positive for fever, chills, diaphoresis and fatigue.  HENT: Positive for sore throat. Negative for ear discharge, facial swelling, mouth sores and postnasal drip.   Respiratory: Negative for cough, chest tightness, shortness of breath and wheezing.   Cardiovascular: Negative for chest pain and palpitations.  Gastrointestinal: Negative for abdominal pain.  Neurological: Negative for dizziness, seizures, light-headedness, numbness and headaches.  Hematological: Does not bruise/bleed easily.  Psychiatric/Behavioral: Negative for behavioral problems and confusion.    Past Medical History  Diagnosis Date  . Anxiety   . Lymphocytic colitis     [name]    Social History   Social History  . Marital Status: Married    Spouse Name: N/A  . Number of Children: N/A  . Years of Education: N/A   Occupational History  . Not on file.   Social History Main Topics  . Smoking status: Never Smoker   . Smokeless tobacco: Never Used  . Alcohol Use: No  . Drug Use: No  . Sexual Activity:    Partners: Male   Other Topics Concern  . Not on file   Social History Narrative    No past surgical history on file.  Family History  Problem Relation Age of Onset  . Lung cancer    . Prostate cancer Paternal Uncle   . Cervical cancer Maternal Aunt   . Arthritis Mother   . Diabetes Mother   . Hypertension Mother   . Diabetes Father     diet controlled    Allergies  Allergen Reactions  . Sulfonamide Derivatives     No current outpatient prescriptions on file prior to visit.   No current facility-administered medications on file prior to visit.    BP 110/72 mmHg  Pulse 88   Temp(Src) 99.7 F (37.6 C) (Oral)  Ht 5\' 7"  (1.702 m)  Wt 147 lb (66.679 kg)  BMI 23.02 kg/m2  SpO2 98%       Objective:   Physical Exam  General  Mental Status - Alert. General Appearance - Well groomed. Not in acute distress.  Skin Rashes- No Rashes.  HEENT Head- Normal. Ear Auditory Canal - Left- Normal. Right - Normal.Tympanic Membrane- Left- Normal. Right- Normal. Eye Sclera/Conjunctiva- Left- Normal. Right- Normal. Nose & Sinuses Nasal Mucosa- Left-  Not boggy or Congested. Right-  Not  boggy or Congested. Mouth & Throat Lips: Upper Lip- Normal: no dryness, cracking, pallor, cyanosis, or vesicular eruption. Lower Lip-Normal: no dryness, cracking, pallor, cyanosis or vesicular eruption. Buccal Mucosa- Bilateral- No Aphthous ulcers. Oropharynx- No Discharge or Erythema. Tonsils: Characteristics- Bilateral-  Erythema + Congestion. Size/Enlargement- Bilateral- No enlargement. Discharge- bilateral-None.  Neck Neck- Supple. No Masses. Mild enlarged submandibular lymph nodes.   Chest and Lung Exam Auscultation: Breath Sounds:- even and unlabored,  Cardiovascular Auscultation:Rythm- Regular, rate and rhythm. Murmurs & Other Heart Sounds:Ausculatation of the heart reveal- No Murmurs.  Lymphatic Head & Neck General Head & Neck Lymphatics: Bilateral: Description- mild bilateral submandibular node tenderness and enlarged.       Assessment & Plan:  Flu test was negative  Will treat for strep based on presentation. Rx azithromycin. Ibuprofen for pain or  fever. Strep test did come back +. Follow up in 7 days or as needed

## 2015-05-01 NOTE — Patient Instructions (Addendum)
Flu test was negative.  Will treat for strep based on presentation. Rx azithromycin. Ibuprofen for pain, bodyaches or fever. Strep test came back +.  Follow up in 7 days or as needed

## 2015-05-01 NOTE — Addendum Note (Signed)
Addended by: Tasia Catchings on: 05/01/2015 07:49 PM   Modules accepted: Orders

## 2015-05-01 NOTE — Progress Notes (Signed)
Pre visit review using our clinic review tool, if applicable. No additional management support is needed unless otherwise documented below in the visit note. 

## 2015-06-12 ENCOUNTER — Telehealth: Payer: Self-pay | Admitting: Family Medicine

## 2015-06-12 NOTE — Telephone Encounter (Signed)
Patient declined Flu Shot °

## 2015-06-12 NOTE — Telephone Encounter (Signed)
HM updated.     KP

## 2015-08-14 ENCOUNTER — Telehealth: Payer: Self-pay | Admitting: *Deleted

## 2015-08-14 NOTE — Telephone Encounter (Signed)
Unable to reach patient at time of pre-visit call. Left message for patient to return call when available.  

## 2015-08-15 ENCOUNTER — Encounter: Payer: Self-pay | Admitting: Family Medicine

## 2015-08-15 ENCOUNTER — Ambulatory Visit (INDEPENDENT_AMBULATORY_CARE_PROVIDER_SITE_OTHER): Payer: BLUE CROSS/BLUE SHIELD | Admitting: Family Medicine

## 2015-08-15 VITALS — BP 112/78 | HR 98 | Temp 98.3°F | Ht 67.0 in | Wt 150.0 lb

## 2015-08-15 DIAGNOSIS — L989 Disorder of the skin and subcutaneous tissue, unspecified: Secondary | ICD-10-CM

## 2015-08-15 NOTE — Patient Instructions (Addendum)
Moles  Moles are usually harmless growths on the skin. They are accumulations of color (pigment) cells in the skin that:   · Can be various colors, from light brown to black.  · Can appear anywhere on the body.  · May remain flat or become raised.  · May contain hairs.  · May remain smooth or develop wrinkling.  Most moles are not cancerous (benign). However, some moles may develop changes and become cancerous. It is important to check your moles every month. If you check your moles regularly, you will be able to notice any changes that may occur.   CAUSES   Moles occur when skin cells grow together in clusters instead of spreading out in the skin as they normally do. The reason for this clustering is unknown.  DIAGNOSIS   Your caregiver will perform a skin examination to diagnose your mole.   TREATMENT   Moles usually do not require treatment. If a mole becomes worrisome, your caregiver may choose to take a sample of the mole or remove it entirely, and then send it to a lab for examination.   HOME CARE INSTRUCTIONS  · Check your mole(s) monthly for changes that may indicate skin cancer. These changes can include:    A change in size.    A change in color. Note that moles tend to darken during pregnancy or when taking birth control pills (oral contraception).    A change in shape.    A change in the border of the mole.  · Wear sunscreen (with an SPF of at least 30) when you spend long periods of time outside. Reapply the sunscreen every 2-3 hours.  · Schedule annual appointments with your skin doctor (dermatologist) if you have a large number of moles.  SEEK MEDICAL CARE IF:  · Your mole changes size, especially if it becomes larger than a pencil eraser.  · Your mole changes in color or develops more than one color.   · Your mole becomes itchy or bleeds.  · Your mole, or the skin near the mole, becomes painful, sore, red, or swollen.  · Your mole becomes scaly, sheds skin, or oozes fluid.   · Your mole develops  irregular borders.  · Your mole becomes flat or develops raised areas.  · Your mole becomes hard or soft.      This information is not intended to replace advice given to you by your health care provider. Make sure you discuss any questions you have with your health care provider.     Document Released: 03/09/2001 Document Revised: 03/08/2012 Document Reviewed: 12/27/2011  Elsevier Interactive Patient Education ©2016 [employer]

## 2015-08-15 NOTE — Progress Notes (Signed)
Pre visit review using our clinic review tool, if applicable. No additional management support is needed unless otherwise documented below in the visit note. 

## 2015-08-15 NOTE — Progress Notes (Signed)
Patient ID: Lori Bullock, female    DOB: [birth year]  Age: 40 y.o. MRN: [id number]    Subjective:  Subjective HPI Lori Bullock presents for red spot on R shin that has been there for about 1 month.   It flakes and she scratches it and it never heals.    Review of Systems  Constitutional: Negative for diaphoresis, appetite change, fatigue and unexpected weight change.  Eyes: Negative for pain, redness and visual disturbance.  Respiratory: Negative for cough, chest tightness, shortness of breath and wheezing.   Cardiovascular: Negative for chest pain, palpitations and leg swelling.  Endocrine: Negative for cold intolerance, heat intolerance, polydipsia, polyphagia and polyuria.  Genitourinary: Negative for dysuria, frequency and difficulty urinating.  Skin: Positive for color change and rash.  Neurological: Negative for dizziness, light-headedness, numbness and headaches.    History Past Medical History  Diagnosis Date  . Anxiety   . Lymphocytic colitis     [name]    She has no past surgical history on file.   Her family history includes Arthritis in her mother; Cervical cancer in her maternal aunt; Diabetes in her father and mother; Hypertension in her father and mother; Lung cancer in her paternal grandmother; Prostate cancer in her paternal uncle.She reports that she has never smoked. She has never used smokeless tobacco. She reports that she does not drink alcohol or use illicit drugs.  No current outpatient prescriptions on file prior to visit.   No current facility-administered medications on file prior to visit.     Objective:  Objective Physical Exam  Skin:      BP 112/78 mmHg  Pulse 98  Temp(Src) 98.3 F (36.8 C) (Oral)  Ht 5\' 7"  (1.702 m)  Wt 150 lb (68.04 kg)  BMI 23.49 kg/m2  SpO2 98%  LMP  (LMP Unknown) Wt Readings from Last 3 Encounters:  08/15/15 150 lb (68.04 kg)  05/01/15 147 lb (66.679 kg)  10/05/11 169 lb 9.6 oz (76.93 kg)      Lab Results  Component Value Date   WBC 6.8 02/02/2011   HGB 12.5 02/02/2011   HCT 37.5 02/02/2011   PLT 242.0 02/02/2011   GLUCOSE 75 02/02/2011   CHOL 161 11/20/2009   TRIG 37.0 11/20/2009   HDL 68.40 11/20/2009   LDLCALC 85 11/20/2009   ALT 11 02/02/2011   AST 20 02/02/2011   NA 143 02/02/2011   K 4.7 02/02/2011   CL 108 02/02/2011   CREATININE 0.9 02/02/2011   BUN 13 02/02/2011   CO2 27 02/02/2011   TSH 0.68 02/16/2011    Dg Wrist Complete Left  02/02/2011  *RADIOLOGY REPORT* Clinical Data: Pain without trauma LEFT WRIST - COMPLETE 3+ VIEW Comparison: None Findings:Carpal rows intact. Negative for fracture, dislocation, or other acute abnormality.  Normal alignment and mineralization. No significant degenerative change.  Regional soft tissues unremarkable. IMPRESSION: Negative Original Report Authenticated By: Trecia Rogers, M.D.    Assessment & Plan:  Plan I have discontinued Ms. Whisenant's azithromycin.  No orders of the defined types were placed in this encounter.    Problem List Items Addressed This Visit    None    Visit Diagnoses    Skin lesion of right leg    -  Primary    Relevant Orders    Ambulatory referral to Dermatology       Follow-up: Return if symptoms worsen or fail to improve.  Garnet Koyanagi, DO

## 2016-04-08 ENCOUNTER — Other Ambulatory Visit: Payer: Self-pay | Admitting: Obstetrics and Gynecology

## 2016-04-09 LAB — CYTOLOGY - PAP

## 2018-08-03 ENCOUNTER — Other Ambulatory Visit: Payer: Self-pay

## 2021-01-23 ENCOUNTER — Encounter: Payer: Self-pay | Admitting: Internal Medicine

## 2021-02-21 ENCOUNTER — Emergency Department (HOSPITAL_BASED_OUTPATIENT_CLINIC_OR_DEPARTMENT_OTHER)
Admission: EM | Admit: 2021-02-21 | Discharge: 2021-02-21 | Disposition: A | Discharge status: Home / Self Care | Payer: BC Managed Care – PPO | Attending: Emergency Medicine | Admitting: Emergency Medicine

## 2021-02-21 ENCOUNTER — Emergency Department (HOSPITAL_BASED_OUTPATIENT_CLINIC_OR_DEPARTMENT_OTHER): Payer: BC Managed Care – PPO

## 2021-02-21 ENCOUNTER — Encounter (HOSPITAL_BASED_OUTPATIENT_CLINIC_OR_DEPARTMENT_OTHER): Payer: Self-pay

## 2021-02-21 DIAGNOSIS — S060X0A Concussion without loss of consciousness, initial encounter: Secondary | ICD-10-CM | POA: Diagnosis not present

## 2021-02-21 DIAGNOSIS — S0990XA Unspecified injury of head, initial encounter: Secondary | ICD-10-CM | POA: Diagnosis present

## 2021-02-21 DIAGNOSIS — W228XXA Striking against or struck by other objects, initial encounter: Secondary | ICD-10-CM | POA: Insufficient documentation

## 2021-02-21 DIAGNOSIS — R112 Nausea with vomiting, unspecified: Secondary | ICD-10-CM

## 2021-02-21 DIAGNOSIS — G44319 Acute post-traumatic headache, not intractable: Secondary | ICD-10-CM

## 2021-02-21 MED ORDER — PROCHLORPERAZINE EDISYLATE 10 MG/2ML IJ SOLN
10.0000 mg | Freq: Once | INTRAMUSCULAR | Status: AC
  Administered 2021-02-21: 10 mg via INTRAMUSCULAR
  Filled 2021-02-21: qty 2

## 2021-02-21 MED ORDER — DIPHENHYDRAMINE HCL 50 MG/ML IJ SOLN
25.0000 mg | Freq: Once | INTRAMUSCULAR | Status: AC
  Administered 2021-02-21: 25 mg via INTRAMUSCULAR
  Filled 2021-02-21: qty 1

## 2021-02-21 NOTE — ED Provider Notes (Signed)
[facility] EMERGENCY DEPT Provider Note   CSN: [id number] Arrival date & time: 02/21/21  0740     History Chief Complaint  Patient presents with   Head Injury    Lori Bullock is a 45 y.o. female.   Head Injury Associated symptoms: headache, nausea and vomiting     Patient is a 45 year old female with no history of prior head injury coming in after hitting her head on a granite countertop. Patient didn't think anything of it initially as she did not have any confusion, did not lose consciousness, and did not have dizziness. The initially injury was about 6 PM yesterday. However, overnight she started to have a really bad headache accompanied by nausea and vomiting. Her head feels really tight and she is bothered by lights. They were concerned she might have a concussion so she came to the emergency room.   Past Medical History:  Diagnosis Date   Anxiety    Lymphocytic colitis    [place]    Patient Active Problem List   Diagnosis Date Noted   Left wrist pain 02/02/2011   Hand joint pain 02/02/2011   HEADACHE 02/10/2010   MOLE 11/20/2009    No past surgical history on file.   OB History   No obstetric history on file.     Family History  Problem Relation Age of Onset   Lung cancer Paternal Grandmother    Prostate cancer Paternal Uncle    Cervical cancer Maternal Aunt    Arthritis Mother    Diabetes Mother    Hypertension Mother    Diabetes Father        diet controlled   Hypertension Father     Social History   Tobacco Use   Smoking status: Never   Smokeless tobacco: Never  Substance Use Topics   Alcohol use: No   Drug use: No    Home Medications Prior to Admission medications   Not on File    Allergies    Sulfonamide derivatives  Review of Systems   Review of Systems  Gastrointestinal:  Positive for nausea and vomiting.  Neurological:  Positive for headaches.  All other systems reviewed and are negative.  Physical  Exam Updated Vital Signs BP 118/67   Pulse 68   Temp 97.9 F (36.6 C) (Oral)   Resp 18   SpO2 98%   Physical Exam Constitutional:      General: She is not in acute distress.    Appearance: Normal appearance.  HENT:     Head: Normocephalic.  Eyes:     Extraocular Movements: Extraocular movements intact.     Pupils: Pupils are equal, round, and reactive to light.  Cardiovascular:     Rate and Rhythm: Normal rate and regular rhythm.  Pulmonary:     Effort: Pulmonary effort is normal.  Abdominal:     General: Abdomen is flat.  Musculoskeletal:        General: Normal range of motion.     Cervical back: Normal range of motion.  Skin:    General: Skin is warm and dry.  Neurological:     General: No focal deficit present.     Mental Status: She is alert and oriented to person, place, and time.     Cranial Nerves: No cranial nerve deficit.     Sensory: No sensory deficit.     Motor: No weakness.     Coordination: Coordination normal.     Gait: Gait normal.  ED Results / Procedures / Treatments   Labs (all labs ordered are listed, but only abnormal results are displayed) Labs Reviewed - No data to display  EKG None  Radiology CT HEAD WO CONTRAST (5MM)  Result Date: 02/21/2021 CLINICAL DATA:  45 year old female status post blunt head trauma. Repeated vomiting. EXAM: CT HEAD WITHOUT CONTRAST TECHNIQUE: Contiguous axial images were obtained from the base of the skull through the vertex without intravenous contrast. COMPARISON:  Head CT 01/04/2011. FINDINGS: Brain: Cerebral volume has not significantly changed since 2012. No midline shift, ventriculomegaly, mass effect, evidence of mass lesion, intracranial hemorrhage or evidence of cortically based acute infarction. New subcortical white matter hypodensity at the left frontal operculum since 2012 on series 2, image 17, but nonspecific. Elsewhere gray-white matter differentiation is within normal limits. There are faint vascular  calcifications at the basal ganglia. Vascular: Mild Calcified atherosclerosis at the skull base.No suspicious intracranial vascular hyperdensity. Skull: No fracture identified. Sinuses/Orbits: Moderate ethmoid and sphenoid sinus mucosal thickening. Mild visible maxillary mucosal thickening. These are symmetric. Tympanic cavities and mastoids are clear. Other: No orbit or scalp soft tissue injury identified. IMPRESSION: 1. No acute traumatic injury identified. 2. New subcortical white matter hypodensity at the left frontal operculum since 2012 is nonspecific. Top differential considerations in this age group include accelerated/hereditary small vessel ischemia, sequelae of hypercoagulable state, vasculitis, migraines, prior infection, or less likely demyelination. 3. Moderate bilateral paranasal sinus inflammation. Electronically Signed   By: Genevie Ann M.D.   On: 02/21/2021 09:17    Procedures Procedures   Medications Ordered in ED Medications  prochlorperazine (COMPAZINE) injection 10 mg (10 mg Intramuscular Given 02/21/21 0819)  diphenhydrAMINE (BENADRYL) injection 25 mg (25 mg Intramuscular Given 02/21/21 0818)    ED Course  I have reviewed the triage vital signs and the nursing notes.  Pertinent labs & imaging results that were available during my care of the patient were reviewed by me and considered in my medical decision making (see chart for details).    MDM Rules/Calculators/A&P                           Patient alert oriented and has normal neuro exam. Headache improved on headache cocktail. CT head did not show any acute abnormality. However new subcortical white matter hypodensity at the left frontal operculum since 2012 is nonspecific. Recommend outpatient neurology follow up, consult provided. Patient is stable for discharge and should follow up with PCP to discuss imaging findings.   Final Clinical Impression(s) / ED Diagnoses Final diagnoses:  Concussion without loss of  consciousness, initial encounter  Traumatic injury of head, initial encounter  Acute post-traumatic headache, not intractable  Non-intractable vomiting with nausea, unspecified vomiting type    Rx / DC Orders ED Discharge Orders          Ordered    Ambulatory referral to Neurology       Comments: An appointment is requested in approximately: 2 weeks   02/21/21 0934             Scarlett Presto, MD 02/21/21 [place], [place], DO 02/21/21 1104

## 2021-02-21 NOTE — Discharge Instructions (Addendum)
You were seen at [facility] Emergency Room for a concussion. We did imaging of your brain and did not see any evidence of a bleed. We recommend that you follow up with a neurologist in the next couple of weeks to discuss the results of the CT as they did see some non-emergent changes on the imaging. The neurologist will call you to set up that appointment. We also provided a referral to establish care with a primary care doctor. We recommend that you come back to the emergency room if you experience loss of consciousness, increasing confusion, inability to walk, or new onset slurred speech. You can take tylenol and ibuprofen for the headache.

## 2021-02-21 NOTE — ED Triage Notes (Signed)
She reports striking her head on a granite counter top as she was quickly leaning over to see to a problem with their dogs. She reports several episodes of n/v during the night. PERRLA @ 41m. She also c/o having a "spinning feeling sometimes". She is oriented x 4 with clear speech.

## 2021-02-21 NOTE — ED Notes (Signed)
Patient transported to CT 

## 2021-02-25 ENCOUNTER — Encounter: Payer: Self-pay | Admitting: Neurology

## 2021-02-25 ENCOUNTER — Ambulatory Visit (INDEPENDENT_AMBULATORY_CARE_PROVIDER_SITE_OTHER): Payer: BC Managed Care – PPO | Admitting: Internal Medicine

## 2021-02-25 ENCOUNTER — Other Ambulatory Visit: Payer: BC Managed Care – PPO

## 2021-02-25 ENCOUNTER — Encounter: Payer: Self-pay | Admitting: Internal Medicine

## 2021-02-25 VITALS — BP 118/66 | HR 75 | Ht 66.0 in | Wt 162.0 lb

## 2021-02-25 DIAGNOSIS — R197 Diarrhea, unspecified: Secondary | ICD-10-CM | POA: Diagnosis not present

## 2021-02-25 DIAGNOSIS — K52832 Lymphocytic colitis: Secondary | ICD-10-CM | POA: Diagnosis not present

## 2021-02-25 DIAGNOSIS — R140 Abdominal distension (gaseous): Secondary | ICD-10-CM

## 2021-02-25 MED ORDER — SUTAB 1479-225-188 MG PO TABS: 1.0000 | ORAL_TABLET | Freq: Once | ORAL | 0 refills | Status: AC

## 2021-02-25 NOTE — Patient Instructions (Signed)
If you are age 44 or older, your body mass index should be between 23-30. Your Body mass index is 26.15 kg/m. If this is out of the aforementioned range listed, please consider follow up with your Primary Care Provider.  If you are age 72 or younger, your body mass index should be between 19-25. Your Body mass index is 26.15 kg/m. If this is out of the aformentioned range listed, please consider follow up with your Primary Care Provider.   __________________________________________________________  The [facility] providers would like to encourage you to use [facility] to communicate with providers for non-urgent requests or questions.  Due to long hold times on the telephone, sending your provider a message by [facility] may be a faster and more efficient way to get a response.  Please allow 48 business hours for a response.  Please remember that this is for non-urgent requests.   Your provider has requested that you go to the basement level for lab work before leaving today. Press "B" on the elevator. The lab is located at the first door on the left as you exit the elevator.  You have been scheduled for an endoscopy and colonoscopy. Please follow the written instructions given to you at your visit today. Please pick up your prep supplies at the pharmacy within the next 1-3 days. If you use inhalers (even only as needed), please bring them with you on the day of your procedure.

## 2021-02-25 NOTE — Progress Notes (Signed)
HISTORY OF PRESENT ILLNESS:  Lori Bullock is a 45 y.o. 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  Patient has had longstanding problems with chronic diarrhea.  She was evaluated in 2007 by Dr. Verl Blalock.  She underwent complete colonoscopy (I have that report and have reviewed it).  The examination was normal including intubation of the terminal ileum.  However, colonic biopsies revealed lymphocytic colitis.  The patient tells me that she was treated with budesonide.  Her diarrhea improved.  However, she discontinued the medication citing weight gain.  At some point thereafter she had recurrent issues with diarrhea this has persisted since to varying degrees.  She has not sought medical attention for this problem since her initial evaluation.  Currently she describes postprandial urgency with loose stools.  Approximately 4-5 bowel movements per day.  Always loose.  No bleeding.  Stable weight.  Occasional nocturnal symptoms with dietary indiscretion.  She rarely takes Imodium, but this does help transiently.  Also describes problems with abdominal discomfort, often cramping.  Additionally bloating.  Her father has a history of irritable bowel syndrome.  She is on no regular medications or supplements.  REVIEW OF SYSTEMS:  All non-GI ROS negative unless otherwise stated in the HPI.  Past Medical History:  Diagnosis Date   Anxiety    Lymphocytic colitis    [name]    Past Surgical History:  Procedure Laterality Date   ANKLE FRACTURE SURGERY      Social History Maxene Staudacher  reports that she has never smoked. She has never used smokeless tobacco. She reports that she does not drink alcohol and does not use drugs.  family history includes Arthritis in her mother; Cervical cancer in her maternal aunt; Diabetes in her father and mother;  Hypertension in her father and mother; Lung cancer in her paternal grandmother; Prostate cancer in her paternal uncle.  Allergies  Allergen Reactions   Sulfonamide Derivatives Anaphylaxis       PHYSICAL EXAMINATION: Vital signs: BP 118/66   Pulse 75   Ht '5\' 6"'$  (1.676 m)   Wt 162 lb (73.5 kg)   BMI 26.15 kg/m   Constitutional: generally well-appearing, no acute distress Psychiatric: alert and oriented x3, cooperative Eyes: extraocular movements intact, anicteric, conjunctiva pink Mouth: oral pharynx moist, no lesions Neck: supple no lymphadenopathy Cardiovascular: heart regular rate and rhythm, no murmur Lungs: clear to auscultation bilaterally Abdomen: soft, nontender, nondistended, no obvious ascites, no peritoneal signs, normal bowel sounds, no organomegaly Rectal: Deferred until colonoscopy Extremities: no rubbing, cyanosis, or lower extremity edema bilaterally Skin: no lesions on visible extremities Neuro: No focal deficits.  Cranial nerves intact  ASSESSMENT:  1.  Chronic diarrhea as described.  Noninflammatory.  No alarm features.  Rule out lymphocytic colitis versus irritable bowel syndrome.  Rule out celiac disease 2.  History of lymphocytic colitis. 3.  Problems with abdominal bloating and abdominal discomfort.  Likely related to #1 above.   PLAN:  1.  Screen for celiac disease with tissue transglutaminase antibody IgA and serum IgA level 2.  Schedule colonoscopy with biopsies to evaluate chronic diarrhea.  Rule out microscopic colitis.The nature of the procedure, as well as the risks, benefits, and alternatives were carefully and thoroughly reviewed with the patient. Ample time for discussion and questions allowed. The patient understood, was satisfied, and agreed to proceed.  3.  Schedule upper endoscopy with biopsies to evaluate abdominal discomfort, and  chronic diarrhea.The nature of the procedure, as well as the risks, benefits, and alternatives were carefully  and thoroughly reviewed with the patient. Ample time for discussion and questions allowed. The patient understood, was satisfied, and agreed to proceed. 4.  Additional recommendations after the above

## 2021-02-26 LAB — IGA: Immunoglobulin A: 141 mg/dL (ref 47–310)

## 2021-02-26 LAB — TISSUE TRANSGLUTAMINASE, IGA: (tTG) Ab, IgA: <1 U/mL

## 2021-04-29 ENCOUNTER — Encounter: Payer: Self-pay | Admitting: Internal Medicine

## 2021-04-29 ENCOUNTER — Ambulatory Visit (AMBULATORY_SURGERY_CENTER): Payer: BC Managed Care – PPO | Admitting: Internal Medicine

## 2021-04-29 VITALS — BP 115/79 | HR 66 | Temp 98.6°F | Resp 14 | Ht 66.0 in | Wt 162.0 lb

## 2021-04-29 DIAGNOSIS — R109 Unspecified abdominal pain: Secondary | ICD-10-CM | POA: Diagnosis not present

## 2021-04-29 DIAGNOSIS — D125 Benign neoplasm of sigmoid colon: Secondary | ICD-10-CM | POA: Diagnosis not present

## 2021-04-29 DIAGNOSIS — R197 Diarrhea, unspecified: Secondary | ICD-10-CM

## 2021-04-29 DIAGNOSIS — R140 Abdominal distension (gaseous): Secondary | ICD-10-CM | POA: Diagnosis not present

## 2021-04-29 MED ORDER — SODIUM CHLORIDE 0.9 % IV SOLN: 500.0000 mL | Freq: Once | INTRAVENOUS | Status: DC

## 2021-04-29 NOTE — Progress Notes (Signed)
HISTORY OF PRESENT ILLNESS:  Lori Bullock is a 45 y.o. female who presents today for colonoscopy and upper endoscopy to evaluate abdominal pain, chronic diarrhea, and bloating.  She was fully evaluated in the office February 25, 2021.  See that complete history and physical examination.  No interval changes.  Testing for celiac disease was negative.  She does have a history of lymphocytic colitis on remote colonoscopy in 2007.  REVIEW OF SYSTEMS:  All non-GI ROS negative.  Past Medical History:  Diagnosis Date   Anxiety    Lymphocytic colitis    Lori Bullock    Past Surgical History:  Procedure Laterality Date   ANKLE FRACTURE SURGERY      Social History Lori Bullock  reports that she has never smoked. She has never used smokeless tobacco. She reports that she does not drink alcohol and does not use drugs.  family history includes Arthritis in her mother; Cervical cancer in her maternal aunt; Colon polyps in her father; Diabetes in her father and mother; Hypertension in her father and mother; Lung cancer in her paternal grandmother; Prostate cancer in her paternal uncle.  Allergies  Allergen Reactions   Sulfonamide Derivatives Anaphylaxis       PHYSICAL EXAMINATION:  Vital signs: BP 103/69   Pulse 67   Temp 98.6 F (37 C)   Resp 19   Ht 5\' 6"  (1.676 m)   Wt 162 lb (73.5 kg)   SpO2 99%   BMI 26.15 kg/m  General: Well-developed, well-nourished, no acute distress HEENT: Sclerae are anicteric, conjunctiva pink. Oral mucosa intact Lungs: Clear Heart: Regular Abdomen: soft, nontender, nondistended, no obvious ascites, no peritoneal signs, normal bowel sounds. No organomegaly. Extremities: No edema Psychiatric: alert and oriented x3. Cooperative     ASSESSMENT:  1.  Chronic diarrhea 2.  Abdominal pain and bloating   PLAN:  1.  Colonoscopy with biopsies 2.  Upper endoscopy with biopsies

## 2021-04-29 NOTE — Progress Notes (Signed)
Called to room to assist during endoscopic procedure.  Patient ID and intended procedure confirmed with present staff. Received instructions for my participation in the procedure from the performing physician.  

## 2021-04-29 NOTE — Patient Instructions (Signed)
Please read handouts provided. Continue present medications. Await pathology results.     YOU HAD AN ENDOSCOPIC PROCEDURE TODAY AT THE [facility]:   Refer to the procedure report that was given to you for any specific questions about what was found during the examination.  If the procedure report does not answer your questions, please call your gastroenterologist to clarify.  If you requested that your care partner not be given the details of your procedure findings, then the procedure report has been included in a sealed envelope for you to review at your convenience later.  YOU SHOULD EXPECT: Some feelings of bloating in the abdomen. Passage of more gas than usual.  Walking can help get rid of the air that was put into your GI tract during the procedure and reduce the bloating. If you had a lower endoscopy (such as a colonoscopy or flexible sigmoidoscopy) you may notice spotting of blood in your stool or on the toilet paper. If you underwent a bowel prep for your procedure, you may not have a normal bowel movement for a few days.  Please Note:  You might notice some irritation and congestion in your nose or some drainage.  This is from the oxygen used during your procedure.  There is no need for concern and it should clear up in a day or so.  SYMPTOMS TO REPORT IMMEDIATELY:   Following lower endoscopy (colonoscopy or flexible sigmoidoscopy):  Excessive amounts of blood in the stool  Significant tenderness or worsening of abdominal pains  Swelling of the abdomen that is new, acute  Fever of 100F or higher   Following upper endoscopy (EGD)  Vomiting of blood or coffee ground material  New chest pain or pain under the shoulder blades  Painful or persistently difficult swallowing  New shortness of breath  Fever of 100F or higher  Black, tarry-looking stools  For urgent or emergent issues, a gastroenterologist can be reached at any hour by calling [phone]. Do not  use MyChart messaging for urgent concerns.    DIET:  We do recommend a small meal at first, but then you may proceed to your regular diet.  Drink plenty of fluids but you should avoid alcoholic beverages for 24 hours.  ACTIVITY:  You should plan to take it easy for the rest of today and you should NOT DRIVE or use heavy machinery until tomorrow (because of the sedation medicines used during the test).    FOLLOW UP: Our staff will call the number listed on your records 48-72 hours following your procedure to check on you and address any questions or concerns that you may have regarding the information given to you following your procedure. If we do not reach you, we will leave a message.  We will attempt to reach you two times.  During this call, we will ask if you have developed any symptoms of COVID 19. If you develop any symptoms (ie: fever, flu-like symptoms, shortness of breath, cough etc.) before then, please call [phone].  If you test positive for Covid 19 in the 2 weeks post procedure, please call and report this information to us.    If any biopsies were taken you will be contacted by phone or by letter within the next 1-3 weeks.  Please call us at [phone] if you have not heard about the biopsies in 3 weeks.    SIGNATURES/CONFIDENTIALITY: You and/or your care partner have signed paperwork which will be entered into your electronic medical   record.  These signatures attest to the fact that that the information above on your After Visit Summary has been reviewed and is understood.  Full responsibility of the confidentiality of this discharge information lies with you and/or your care-partner. 

## 2021-04-29 NOTE — Progress Notes (Signed)
CHECK-IN-AM ° °V/S-CW °

## 2021-04-29 NOTE — Progress Notes (Signed)
A and O x3. Report to RN. Tolerated MAC anesthesia well.Teeth unchanged after procedure. 

## 2021-04-29 NOTE — Op Note (Signed)
[facility] Patient Name: Lori Bullock Procedure Date: 04/29/2021 3:13 PM MRN: [id number] Endoscopist: Docia Chuck. Henrene Pastor , MD Age: 45 Referring MD:  Date of Birth: 1976/02/09 Gender: Female Account #: [id number] Procedure:                Upper GI endoscopy with biopsies Indications:              Abdominal pain, Abdominal bloating, Diarrhea Medicines:                Monitored Anesthesia Care Procedure:                Pre-Anesthesia Assessment:                           - Prior to the procedure, a History and Physical                            was performed, and patient medications and                            allergies were reviewed. The patient's tolerance of                            previous anesthesia was also reviewed. The risks                            and benefits of the procedure and the sedation                            options and risks were discussed with the patient.                            All questions were answered, and informed consent                            was obtained. Prior Anticoagulants: The patient has                            taken no previous anticoagulant or antiplatelet                            agents. ASA Grade Assessment: I - A normal, healthy                            patient. After reviewing the risks and benefits,                            the patient was deemed in satisfactory condition to                            undergo the procedure.                           After obtaining informed consent, the endoscope was  passed under direct vision. Throughout the                            procedure, the patient's blood pressure, pulse, and                            oxygen saturations were monitored continuously. The                            GIF [id number] was introduced through the                            mouth, and advanced to the second part of duodenum.                            The  upper GI endoscopy was accomplished without                            difficulty. The patient tolerated the procedure                            well. Scope In: Scope Out: Findings:                 The esophagus was normal.                           The stomach was normal.                           The examined duodenum was normal. Biopsies were                            taken with a cold forceps for histology. Biopsies                            for histology were taken with a cold forceps for                            evaluation of celiac disease.                           The cardia and gastric fundus were normal on                            retroflexion. Complications:            No immediate complications. Estimated Blood Loss:     Estimated blood loss: none. Impression:               - Normal esophagus.                           - Normal stomach.                           - Normal examined duodenum. Biopsied. Recommendation:           -  Patient has a contact number available for                            emergencies. The signs and symptoms of potential                            delayed complications were discussed with the                            patient. Return to normal activities tomorrow.                            Written discharge instructions were provided to the                            patient.                           - Resume previous diet.                           - Continue present medications.                           - Await pathology results.                           - Recommendations and follow-up to be determined                            after all of the pathology has returned Docia Chuck. Henrene Pastor, MD 04/29/2021 4:10:07 PM This report has been signed electronically.

## 2021-04-29 NOTE — Op Note (Signed)
[facility] Patient Name: Lori Bullock Procedure Date: 04/29/2021 3:13 PM MRN: [id number] Endoscopist: Docia Chuck. Henrene Pastor , MD Age: 45 Referring MD:  Date of Birth: [birth year] Gender: Female Account #: [id number] Procedure:                Colonoscopy with cold snare polypectomy x 1; with                            biopsies Indications:              Chronic diarrhea. Remote history of lymphocytic                            colitis. Medicines:                Monitored Anesthesia Care Procedure:                Pre-Anesthesia Assessment:                           - Prior to the procedure, a History and Physical                            was performed, and patient medications and                            allergies were reviewed. The patient's tolerance of                            previous anesthesia was also reviewed. The risks                            and benefits of the procedure and the sedation                            options and risks were discussed with the patient.                            All questions were answered, and informed consent                            was obtained. Prior Anticoagulants: The patient has                            taken no previous anticoagulant or antiplatelet                            agents. ASA Grade Assessment: I - A normal, healthy                            patient. After reviewing the risks and benefits,                            the patient was deemed in satisfactory condition to  undergo the procedure.                           After obtaining informed consent, the colonoscope                            was passed under direct vision. Throughout the                            procedure, the patient's blood pressure, pulse, and                            oxygen saturations were monitored continuously. The                            Olympus CF-HQ190L (Serial# [id number]) Colonoscope was                             introduced through the anus and advanced to the the                            cecum, identified by appendiceal orifice and                            ileocecal valve. The terminal ileum, ileocecal                            valve, appendiceal orifice, and rectum were                            photographed. The quality of the bowel preparation                            was excellent. The colonoscopy was performed                            without difficulty. The patient tolerated the                            procedure well. The bowel preparation used was                            SUPREP/tablets via split dose instruction. Scope In: 3:30:59 PM Scope Out: 3:48:33 PM Scope Withdrawal Time: 0 hours 12 minutes 58 seconds  Total Procedure Duration: 0 hours 17 minutes 34 seconds  Findings:                 The terminal ileum appeared normal. There were a                            few sigmoid diverticula.                           A 5 mm polyp was found in the sigmoid colon. The  polyp was removed with a cold snare. Resection and                            retrieval were complete.                           The entire examined colon appeared otherwise normal                            on direct and retroflexion views. Biopsies for                            histology were taken with a cold forceps from the                            entire colon for evaluation of microscopic colitis. Complications:            No immediate complications. Estimated blood loss:                            None. Estimated Blood Loss:     Estimated blood loss: none. Impression:               - The examined portion of the ileum was normal. A                            few sigmoid diverticula.                           - One 5 mm polyp in the sigmoid colon, removed with                            a cold snare. Resected and retrieved.                           - The entire  examined colon is normal on direct and                            retroflexion views. Recommendation:           - Repeat colonoscopy in 7-10 years for surveillance.                           - Patient has a contact number available for                            emergencies. The signs and symptoms of potential                            delayed complications were discussed with the                            patient. Return to normal activities tomorrow.  Written discharge instructions were provided to the                            patient.                           - Resume previous diet.                           - Continue present medications.                           - Await pathology results. Docia Chuck. Henrene Pastor, MD 04/29/2021 4:07:40 PM This report has been signed electronically.

## 2021-05-01 ENCOUNTER — Telehealth: Payer: Self-pay

## 2021-05-01 NOTE — Telephone Encounter (Signed)
Called [phone] and left a message we tried to reach pt for a follow up call. maw

## 2021-05-01 NOTE — Telephone Encounter (Signed)
Left message on follow up call. 

## 2021-05-05 ENCOUNTER — Encounter: Payer: Self-pay | Admitting: Internal Medicine

## 2021-05-05 ENCOUNTER — Other Ambulatory Visit: Payer: Self-pay

## 2021-05-05 MED ORDER — COLESTIPOL HCL 1 G PO TABS: 2.0000 g | ORAL_TABLET | Freq: Two times a day (BID) | ORAL | 6 refills | Status: DC

## 2021-05-20 NOTE — Progress Notes (Signed)
NEUROLOGY CONSULTATION NOTE  Lori Bullock MRN: [id number] DOB: 01-29-1976  Referring provider: Deno Etienne, DO (ED referral) Primary care provider: No PCP  Reason for consult:  white matter abnormality on head CT  Assessment/Plan:   Abnormal CT head - nonspecific finding - may be related to history of migraines.   Will check MRI of brain with and without contrast.  Further recommendations pending results.   Subjective:  Lori Bullock is a 45 year old female who presents for evaluation of white matter abnormality on head CT.  History supplemented by ED note.  CT head from 02/21/2021, 01/04/2011 and 02/10/2010 personally reviewed.  She presented to the ED on 02/21/2021 for evaluation of possible concussion.  The previous night, she hit her head on the granite countertop.  No loss of consciousness.  Overnight, she developed a severe headache, tight sensation, with nausea, vomiting and photophobia.  No confusion or dizziness.  She was treated with a headache cocktail.  CT head personally reviewed showed a subcortical white matter hypodensity at the left frontal operculum.  This was not seen on prior head CTs from 01/03/2021 and 02/10/2010.  She reports history of severe headaches, sharp pain between eyes with nausea.  They subsided after she had her son in 2012.      PAST MEDICAL HISTORY: Past Medical History:  Diagnosis Date   Anxiety    Lymphocytic colitis    [place]    PAST SURGICAL HISTORY: Past Surgical History:  Procedure Laterality Date   ANKLE FRACTURE SURGERY      MEDICATIONS: Current Outpatient Medications on File Prior to Visit  Medication Sig Dispense Refill   colestipol (COLESTID) 1 g tablet Take 2 tablets (2 g total) by mouth 2 (two) times daily. 120 tablet 6   No current facility-administered medications on file prior to visit.    ALLERGIES: Allergies  Allergen Reactions   Sulfonamide Derivatives Anaphylaxis    FAMILY HISTORY: Family History   Problem Relation Age of Onset   Arthritis Mother    Diabetes Mother    Hypertension Mother    Colon polyps Father    Diabetes Father        diet controlled   Hypertension Father    Cervical cancer Maternal Aunt    Prostate cancer Paternal Uncle    Lung cancer Paternal Grandmother    Colon cancer Neg Hx    Esophageal cancer Neg Hx    Stomach cancer Neg Hx    Rectal cancer Neg Hx     Objective:  Blood pressure 124/81, pulse 90, height 5\' 6"  (1.676 m), weight 164 lb 6.4 oz (74.6 kg), SpO2 98 %. General: No acute distress.  Patient appears well-groomed.   Head:  Normocephalic/atraumatic Eyes:  fundi examined but not visualized Neck: supple, no paraspinal tenderness, full range of motion Back: No paraspinal tenderness Heart: regular rate and rhythm Lungs: Clear to auscultation bilaterally. Vascular: No carotid bruits. Neurological Exam: Mental status: alert and oriented to person, place, and time, recent and remote memory intact, fund of knowledge intact, attention and concentration intact, speech fluent and not dysarthric, language intact. Cranial nerves: CN I: not tested CN II: pupils equal, round and reactive to light, visual fields intact CN III, IV, VI:  full range of motion, no nystagmus, no ptosis CN V: facial sensation intact. CN VII: upper and lower face symmetric CN VIII: hearing intact CN IX, X: gag intact, uvula midline CN XI: sternocleidomastoid and trapezius muscles intact CN XII: tongue midline Bulk & Tone:  normal, no fasciculations. Motor:  muscle strength 5/5 throughout Sensation:  Pinprick, temperature and vibratory sensation intact. Deep Tendon Reflexes:  2+ throughout,  toes downgoing.   Finger to nose testing:  Without dysmetria.   Heel to shin:  Without dysmetria.   Gait:  Normal station and stride.  Romberg negative.    Thank you for allowing me to take part in the care of this patient.  Metta Clines, DO

## 2021-05-25 ENCOUNTER — Encounter: Payer: Self-pay | Admitting: Neurology

## 2021-05-25 ENCOUNTER — Other Ambulatory Visit: Payer: Self-pay

## 2021-05-25 ENCOUNTER — Ambulatory Visit (INDEPENDENT_AMBULATORY_CARE_PROVIDER_SITE_OTHER): Payer: BC Managed Care – PPO | Admitting: Neurology

## 2021-05-25 VITALS — BP 124/81 | HR 90 | Ht 66.0 in | Wt 164.4 lb

## 2021-05-25 DIAGNOSIS — R930 Abnormal findings on diagnostic imaging of skull and head, not elsewhere classified: Secondary | ICD-10-CM | POA: Diagnosis not present

## 2021-05-25 NOTE — Patient Instructions (Signed)
We will check MRI of brain with and without contrast.  Further recommendations pending results.  If it needs further evaluation/conversation, will have you follow up.  Otherwise, I would recommend repeating in one year.

## 2021-06-16 ENCOUNTER — Ambulatory Visit (INDEPENDENT_AMBULATORY_CARE_PROVIDER_SITE_OTHER): Payer: BC Managed Care – PPO | Admitting: Internal Medicine

## 2021-06-16 ENCOUNTER — Encounter: Payer: Self-pay | Admitting: Internal Medicine

## 2021-06-16 VITALS — BP 110/80 | HR 89 | Ht 66.0 in | Wt 165.4 lb

## 2021-06-16 DIAGNOSIS — D125 Benign neoplasm of sigmoid colon: Secondary | ICD-10-CM | POA: Diagnosis not present

## 2021-06-16 DIAGNOSIS — R197 Diarrhea, unspecified: Secondary | ICD-10-CM | POA: Diagnosis not present

## 2021-06-16 DIAGNOSIS — R140 Abdominal distension (gaseous): Secondary | ICD-10-CM

## 2021-06-16 MED ORDER — COLESTIPOL HCL 1 G PO TABS: 2.0000 g | ORAL_TABLET | Freq: Two times a day (BID) | ORAL | 1 refills | Status: DC

## 2021-06-16 NOTE — Patient Instructions (Signed)
Call office in 1 month to let Dr Henrene Pastor know how your doing on Colestid. At that time he will make further recommendations based off of how your feeling.  If you are age 44 or older, your body mass index should be between 23-30. Your Body mass index is 26.7 kg/m. If this is out of the aforementioned range listed, please consider follow up with your Primary Care Provider.  If you are age 51 or younger, your body mass index should be between 19-25. Your Body mass index is 26.7 kg/m. If this is out of the aformentioned range listed, please consider follow up with your Primary Care Provider.   ________________________________________________________  The [facility] providers would like to encourage you to use [facility] to communicate with providers for non-urgent requests or questions.  Due to long hold times on the telephone, sending your provider a message by [facility] may be a faster and more efficient way to get a response.  Please allow 48 business hours for a response.  Please remember that this is for non-urgent requests.  _______________________________________________________  Thank you for choosing me and [facility].  Dr.John Henrene Pastor

## 2021-06-16 NOTE — Progress Notes (Signed)
HISTORY OF PRESENT ILLNESS:  Lori Bullock is a 45 y.o. 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  See that dictation for details.  She was seen remotely and given a diagnosis of lymphocytic colitis.  At the time of her more recent evaluation we did screening serologies for celiac disease.  These were negative.  She subsequently underwent complete colonoscopy.  Random colon biopsies were normal.  No microscopic colitis.  She did have an incidental tubular adenoma.  Upper endoscopy was unremarkable including duodenal biopsies.  We contacted her and recommended/prescribed Colestid 2 g twice daily.  She did pick this up from the pharmacy.  After doing research, she was not clear why we had prescribed this medication and decided to wait until this office follow-up to discuss.  No interval new problems.  Issues with bowels remain the same (4-5 loose bowel movements exacerbated by meals, with abdominal discomfort and bloating).  REVIEW OF SYSTEMS:  All non-GI ROS negative.  Past Medical History:  Diagnosis Date   Anxiety    Lymphocytic colitis    [name]    Past Surgical History:  Procedure Laterality Date   ANKLE FRACTURE SURGERY      Social History Caylie Sandquist  reports that she has never smoked. She has never used smokeless tobacco. She reports that she does not drink alcohol and does not use drugs.  family history includes Arthritis in her mother; Cervical cancer in her maternal aunt; Colon polyps in her father; Diabetes in her father and mother; Hypertension in her father and mother; Lung cancer in her paternal grandmother; Prostate cancer in her paternal uncle.  Allergies  Allergen Reactions   Sulfonamide Derivatives Anaphylaxis       PHYSICAL EXAMINATION: Vital signs: BP 110/80    Pulse 89    Ht 5\' 6"  (1.676 m)    Wt 165 lb 6.4 oz (75 kg)    SpO2 99%     BMI 26.70 kg/m   Constitutional: generally well-appearing, no acute distress Psychiatric: alert and oriented x3, cooperative Eyes: Anicteric Mouth: Mask Abdomen: Not reexamined Skin: no obvious lesions on visible extremities Neuro: Grossly intact  ASSESSMENT:  1.  Chronic postprandial diarrhea as described.  Negative extensive work-up to date.  Question bile salt related.  Question IBS 2.  Diminutive adenoma   PLAN:  1.  We discussed the role of Colestid as a bile salt resin binder and its effect on bile salt related diarrhea.  She will initiate therapy.  Medication effects and risks reviewed. 2.  I have asked her to contact the office in about 4 weeks to give me an update regarding Colestid.  We can make adjustments if needed from there. 3.  Surveillance colonoscopy 7 years

## 2021-06-30 ENCOUNTER — Telehealth: Payer: Self-pay

## 2021-06-30 NOTE — Telephone Encounter (Signed)
error 

## 2021-07-02 ENCOUNTER — Ambulatory Visit
Admission: RE | Admit: 2021-07-02 | Discharge: 2021-07-02 | Disposition: A | Discharge status: Home / Self Care | Payer: BC Managed Care – PPO | Source: Ambulatory Visit | Attending: Neurology | Admitting: Neurology

## 2021-07-02 ENCOUNTER — Other Ambulatory Visit: Payer: Self-pay

## 2021-07-02 DIAGNOSIS — R930 Abnormal findings on diagnostic imaging of skull and head, not elsewhere classified: Secondary | ICD-10-CM

## 2021-07-02 MED ORDER — GADOBENATE DIMEGLUMINE 529 MG/ML IV SOLN
15.0000 mL | Freq: Once | INTRAVENOUS | Status: AC | PRN
  Administered 2021-07-02: 15 mL via INTRAVENOUS

## 2021-07-09 NOTE — Progress Notes (Signed)
LMOVM for pt to call the office back.

## 2021-07-10 ENCOUNTER — Telehealth: Payer: Self-pay | Admitting: Neurology

## 2021-07-10 ENCOUNTER — Telehealth: Payer: Self-pay

## 2021-07-10 DIAGNOSIS — G379 Demyelinating disease of central nervous system, unspecified: Secondary | ICD-10-CM

## 2021-07-10 NOTE — Telephone Encounter (Signed)
Patient returned call for MRI results.

## 2021-07-10 NOTE — Telephone Encounter (Signed)
Pt advised of MRI results.  Cervical And thoracic spine with and without contrast ordered.

## 2021-07-10 NOTE — Telephone Encounter (Signed)
-----   Message from Pieter Partridge, DO sent at 07/05/2021 12:03 PM EST ----- Reviewed MRI.  Shows nonspecific findings - spots on the brain but does not clearly correlate with a specific etiology.  I would like to order MRI of cervical and thoracic spine with and without contrast to evaluate for any similar abnormality involving the spinal cord.

## 2021-07-10 NOTE — Telephone Encounter (Signed)
See results telephone call note.

## 2021-07-10 NOTE — Telephone Encounter (Signed)
LMOVM for pt, Already spoke to patient in regards to Results. Not sure if she had question or not.

## 2023-03-22 IMAGING — MR MR HEAD WO/W CM
12 series · 48 of 48 positions shown · IV contrast (multihance)
Comparison: Prior head CT examinations 02/21/2021 and earlier.

CLINICAL DATA: Provided history: Abnormal CT of head. Additional
history provided by scanning technologist: Patient reports head
trauma in Tuesday January, 2021. Vomiting.

EXAM:
MRI HEAD WITHOUT AND WITH CONTRAST
TECHNIQUE: Multiplanar, multiecho pulse sequences of the brain and surrounding
structures were obtained without and with intravenous contrast.
CONTRAST:  15mL MULTIHANCE GADOBENATE DIMEGLUMINE 529 MG/ML IV SOLN

[Series 2: T1 · sagittal · 5.0mm · 0.45mm/px · 2 of 23 slices shown]
[im 1/23]
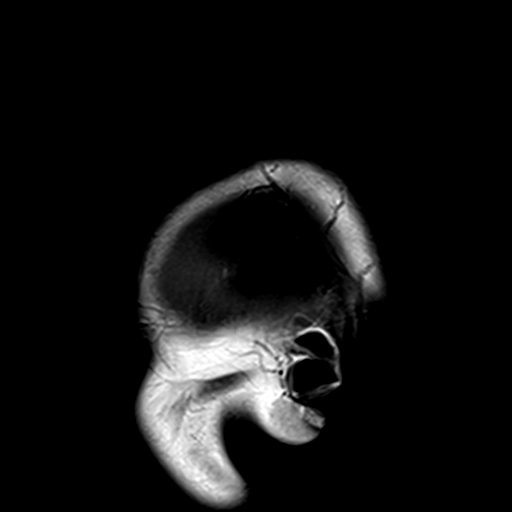
[im 23/23]
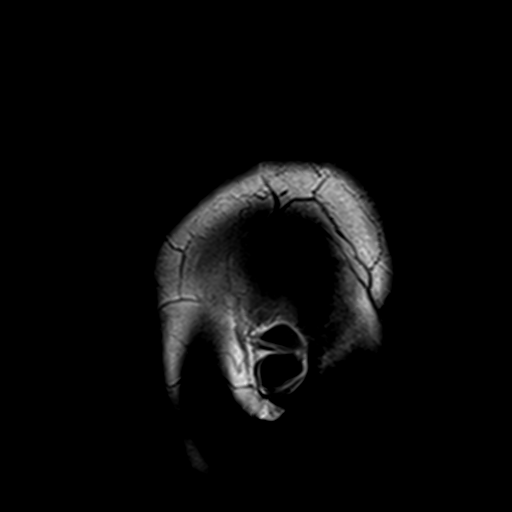

[Series 3: DWI · axial · 3.0mm · 1.80mm/px · z∈[-69,+76]mm · 7 of 100 slices shown (1 of 4)]
[im 1/100]
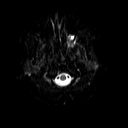
[im 17/100]
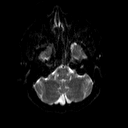
[im 34/100]
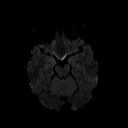
[im 50/100]
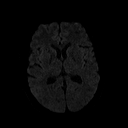
[im 67/100]
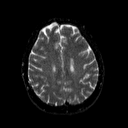
[im 83/100]
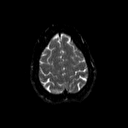
[im 100/100]
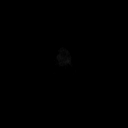

[Series 4: DWI · axial · 3.0mm · 1.80mm/px · z∈[-69,+76]mm · 3 of 49 slices shown (2 of 4)]
[im 1/49]
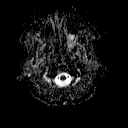
[im 25/49]
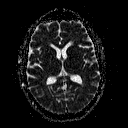
[im 49/49]
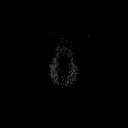

[Series 5: DWI · coronal · 5.0mm · 1.80mm/px · 5 of 72 slices shown (3 of 4)]
[im 1/72]
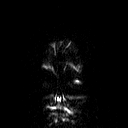
[im 18/72]
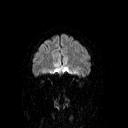
[im 36/72]
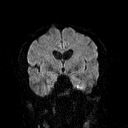
[im 54/72]
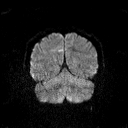
[im 72/72]
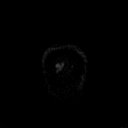

[Series 6: DWI · coronal · 5.0mm · 1.80mm/px · 2 of 36 slices shown (4 of 4)]
[im 1/36]
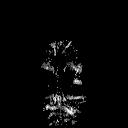
[im 36/36]
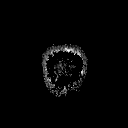

[Series 7: T2 · axial · 5.0mm · 0.60mm/px · 1 of 22 slices shown (1 of 2)]
[im 1/22]
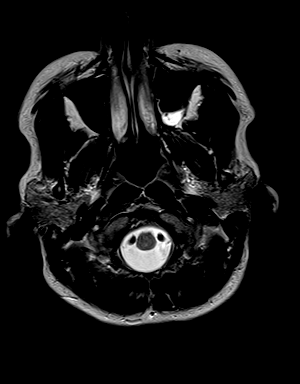

[Series 8: FLAIR · axial · 3.0mm · 0.45mm/px · z∈[-63,+70]mm · 2 of 30 slices shown]
[im 1/30]
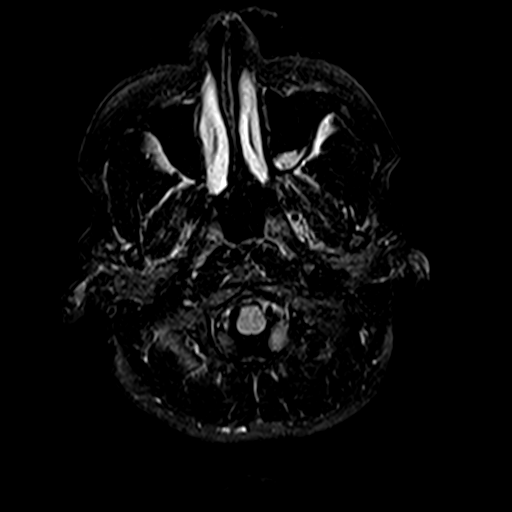
[im 30/30]
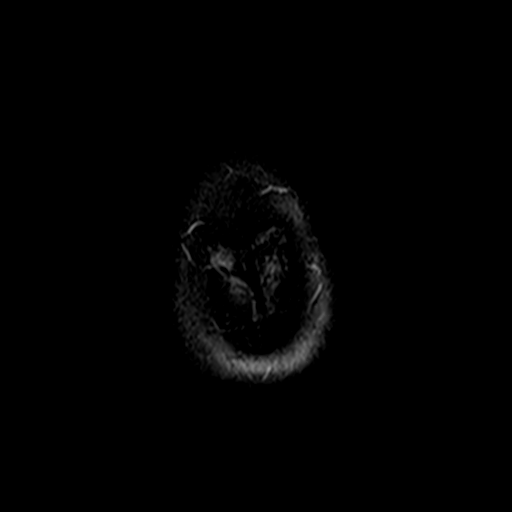

[Series 10: swi_images · axial · 4.0mm · 0.90mm/px · z∈[-65,+73]mm · 2 of 36 slices shown]
[im 1/36]
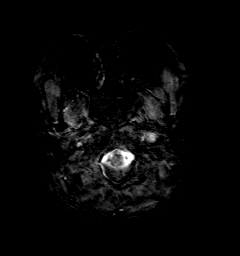
[im 36/36]
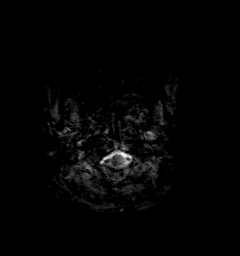

[Series 11: t1_mpr_tra · axial · 1.0mm · 0.75mm/px · z∈[-74,+66]mm · 10 of 144 slices shown]
[im 1/144]
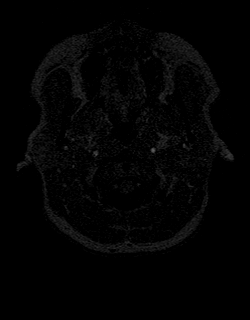
[im 16/144]
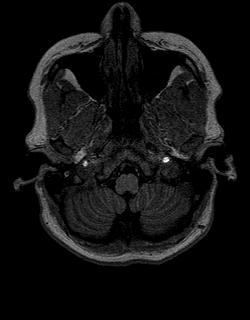
[im 32/144]
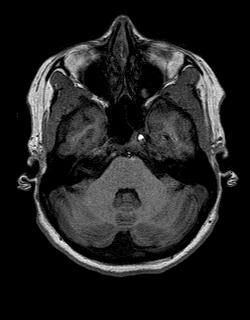
[im 48/144]
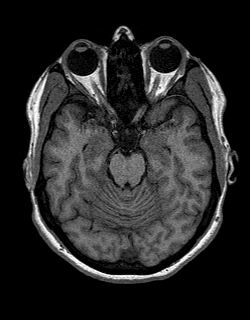
[im 64/144]
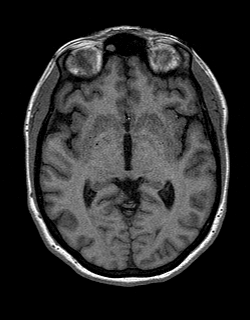
[im 80/144]
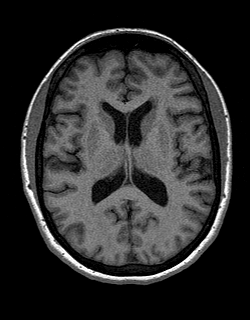
[im 96/144]
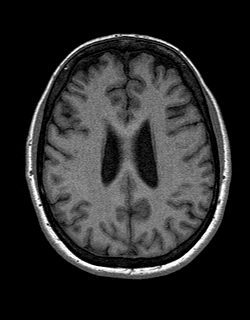
[im 112/144]
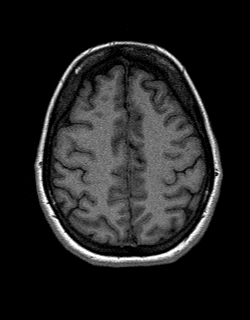
[im 128/144]
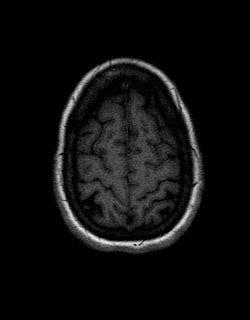
[im 144/144]
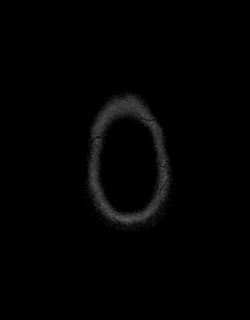

[Series 12: T2 · coronal · 5.0mm · 0.45mm/px · 2 of 28 slices shown (2 of 2)]
[im 1/28]
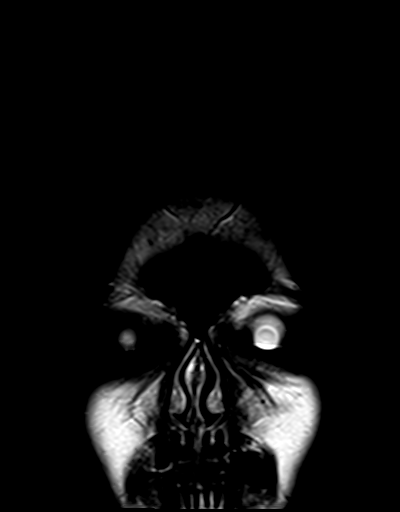
[im 28/28]
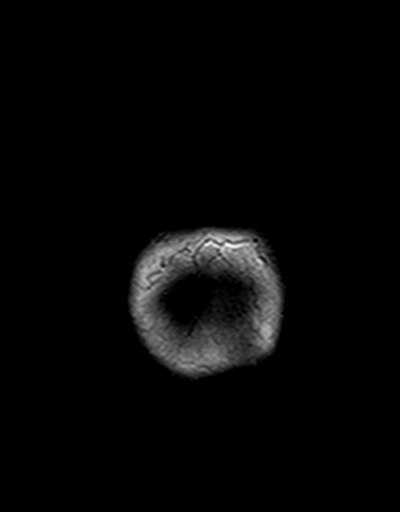

[Series 13: t1_mpr_tra post · axial · 1.0mm · 0.75mm/px · z∈[-74,+66]mm · 10 of 144 slices shown]
[im 1/144]
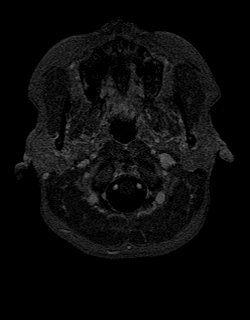
[im 16/144]
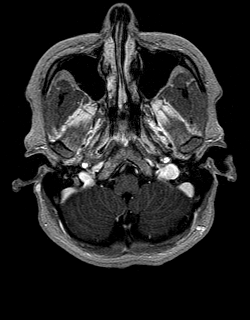
[im 32/144]
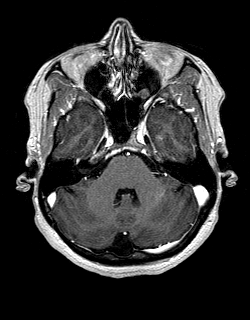
[im 48/144]
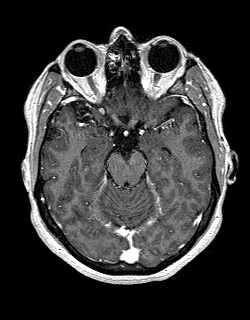
[im 64/144]
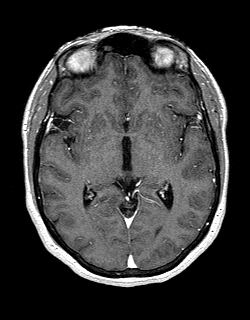
[im 80/144]
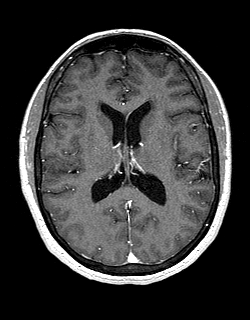
[im 96/144]
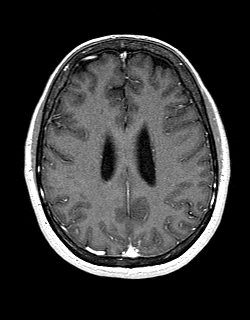
[im 112/144]
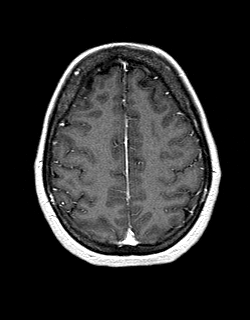
[im 128/144]
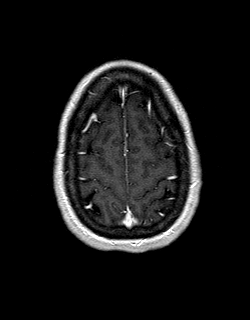
[im 144/144]
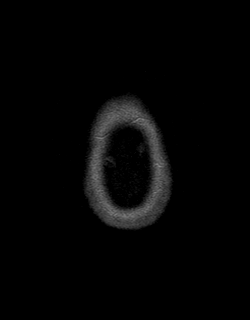

[Series 14: post cor · coronal · 5.0mm · 0.45mm/px · 2 of 28 slices shown]
[im 1/28]
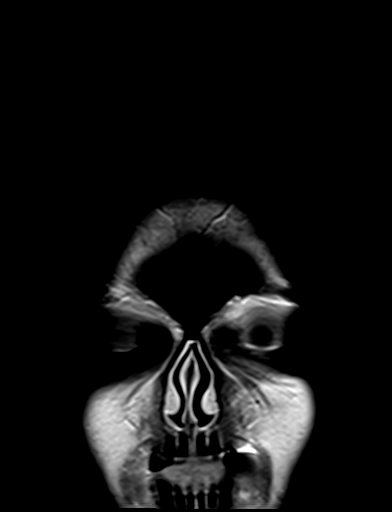
[im 28/28]
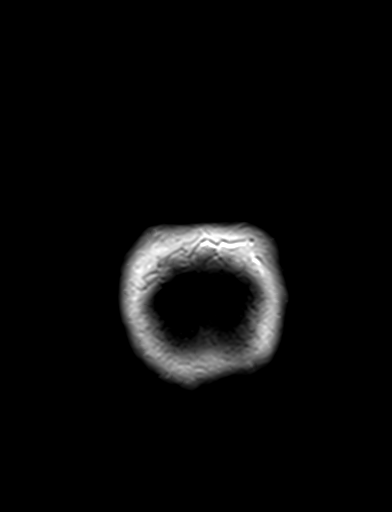

[48 of 48 positions shown; findings below may reference images not displayed]

FINDINGS: Brain:

Cerebral volume is normal.

Multifocal T2 FLAIR hyperintense signal abnormality within the
cerebral white matter, overall mild but advanced for age. The
largest focus of signal abnormality is present within the mid to
posterior left frontal lobe subcortical white matter, measuring 12
mm (for instance as seen on series 8, image 18).

Retro-cerebellar CSF intensity prominence at midline, measuring
x 1.5 cm in transaxial dimensions. This may reflect a mega cisterna
magna or posterior fossa arachnoid cyst.

There is no acute infarct.

No evidence of an intracranial mass.

No chronic intracranial blood products.

No extra-axial fluid collection.

No midline shift.

No pathologic intracranial enhancement identified.

Vascular: Maintained flow voids within the proximal large arterial
vessels.

Skull and upper cervical spine: No focal suspicious marrow lesion.

Sinuses/Orbits: Visualized orbits show no acute finding. Small
mucous retention cyst within the right frontal sinus.
Mild-to-moderate mucosal thickening within the bilateral ethmoid
sinuses. Mild mucosal thickening within the bilateral sphenoid
sinuses. Small mucous retention cyst within the right maxillary
sinus. Small volume fluid within the dependent left maxillary sinus.
IMPRESSION: No evidence of acute intracranial abnormality.

Multifocal T2 FLAIR hyperintense signal abnormality within the
cerebral white matter, overall mild but advanced for age. These
signal changes are nonspecific and differential considerations
include chronic small vessel ischemic disease, sequela of a prior
infectious/inflammatory process, sequela of chronic migraine
headaches, hypercoagulable state, vasculitis or a demyelinating
process (such as multiple sclerosis), among others.

Otherwise unremarkable MRI appearance of the brain.

Mega cisterna magna versus posterior fossa arachnoid cyst.

Paranasal sinus disease, as described.

## 2023-05-10 NOTE — Progress Notes (Signed)
"  °  Subjective:    Lori Bullock is a 47 y.o. (DOB 16-May-1976) female.     Patient presents with   Hoarse    Pt presents today with hoarseness. Lost voice on sunday     Lori Bullock presents with hoarseness after recent common cold.  She is at the tail end of the cold.  Hoarseness was exacerbated after screaming at her son's hockey game.  No sore throat or pain.  She is concerned because she has several presentations at work this week as it is retirement benefits season.     Reviewed and updated this visit by provider: Tobacco  Allergies  Meds  Problems  Med Hx  Surg Hx  Fam Hx       Review of Systems   Objective:   Vitals:   05/10/23 0936  BP: 117/78  Patient Position: Sitting  Pulse: 76  Temp: 98.3 F (36.8 C)  TempSrc: Oral  Resp: 18  Height: 5' 6 (1.676 m)  Weight: 162 lb (73.5 kg)  SpO2: 98%  BMI (Calculated): 26.2   Physical Exam    Assessment / Plan:   Assessment Lori Bullock was seen today for hoarse. 1. Acute laryngitis (Primary) -     methylPREDNISolone acetate (DEPO-MEDROL) injection 80 mg; 80 mg, Intramuscular, Once, On Tue 05/10/23 at 1045, For 1 dose 2. Acute nasopharyngitis (common cold)    Plan  No further treatment needed for the cold.  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  Risks, benefits, and alternatives of the medications and treatment plan prescribed today were discussed, and patient expressed understanding. Plan follow-up as discussed or as needed if any worsening symptoms or change in condition.         "

## 2024-04-18 NOTE — Progress Notes (Signed)
 [facility] Video Visit   Patient ID:  Lori Bullock is a 48 y.o. (DOB 1976/04/13) female Place of service: patient home Patient has been advised as to the limitations and limited nature of physical exam due to nature of a video visit, the possibility of privacy risk in the use of a video visit, and that the healthcare provider may recommend visiting a healthcare clinic for in-person care and follow up.  Video Visit Assessment and Plan   1. Acute bacterial sinusitis (Primary) 2. Acute bacterial bronchitis Other orders -     doxycycline hyclate (VIBRAMYCIN) 100 mg capsule; One tablet twice a day, Normal    Recommend treat with doxycycline 100 mg twice a day for 10 days recommend Mucinex DM for cough and congestion follow-up ASAP if worse or not any better strict ED precautions reviewed  Patient is aware she is overdue for complete physical exam she will get that scheduled  Patient verbalized to me that they understood what their problem is, what they need to do about it and why it is important that they do it. I have reviewed the information contained in this note and personally verified its accuracy.  I obtained the history of present illness and personally performed the physical exam Voice recognition software was used in creation of this note . Despite my best efforts at editing the text, some misspellings / errors  may have occurred.     Outpatient Encounter Medications as of 04/18/2024:    doxycycline hyclate (VIBRAMYCIN) 100 mg capsule, One tablet twice a day   meloxicam (MOBIC) 15 mg tablet, Take one tablet (15 mg dose) by mouth daily.   [DISCONTINUED] oxyCODONE-acetaminophen  (PERCOCET) 5-325 mg per tablet, Take 1 tablet by mouth every 6 (six) hours as needed.   [DISCONTINUED] promethazine (PHENERGAN) 25 MG tablet, Take 25 mg by mouth every 6 (six) hours as needed.   Risk, benefits, and alternatives were provided through patient instructions given to the  patient electronically and during the video interaction.  If any worsening symptoms or lack of improvement, the patient will seek immediate medical care.  Video Visit History  No chief complaint on file.    HPI   Patient is here complaining of 2-week history of head and chest congestion No fever No body aches or chills No chest pain shortness of breath She has had a lot of head congestion nasal congestion and cough is wet She has been taken DayQuil and NyQuil and over the past 2 weeks is not any better  Reviewed and updated this visit by provider: Tobacco  Allergies  Meds  Problems  Med Hx  Surg Hx  Fam Hx        Review of Systems     Video Visit Objective Findings   Examination conducted with the use of video cameras/computer monitors. Vital signs and other aspects of physical exam are limited due to the nature of this encounter.   Physical Exam Constitutional:      General: She is not in acute distress.    Appearance: Normal appearance. She is normal weight. She is not ill-appearing, toxic-appearing or diaphoretic.  Neurological:     Mental Status: She is alert.  Psychiatric:        Mood and Affect: Mood normal.        Behavior: Behavior normal.        Thought Content: Thought content normal.        Judgment: Judgment normal.

## 2024-07-03 ENCOUNTER — Encounter (HOSPITAL_BASED_OUTPATIENT_CLINIC_OR_DEPARTMENT_OTHER): Payer: Self-pay | Admitting: Emergency Medicine

## 2024-07-03 ENCOUNTER — Emergency Department (HOSPITAL_BASED_OUTPATIENT_CLINIC_OR_DEPARTMENT_OTHER)

## 2024-07-03 ENCOUNTER — Other Ambulatory Visit: Payer: Self-pay

## 2024-07-03 ENCOUNTER — Inpatient Hospital Stay (HOSPITAL_BASED_OUTPATIENT_CLINIC_OR_DEPARTMENT_OTHER)
Admission: EM | Admit: 2024-07-03 | Discharge: 2024-07-11 | DRG: 065 | Disposition: A | Discharge status: Home / Self Care | Attending: Neurosurgery | Admitting: Neurosurgery

## 2024-07-03 DIAGNOSIS — E871 Hypo-osmolality and hyponatremia: Secondary | ICD-10-CM | POA: Diagnosis present

## 2024-07-03 DIAGNOSIS — Z8261 Family history of arthritis: Secondary | ICD-10-CM | POA: Diagnosis not present

## 2024-07-03 DIAGNOSIS — I609 Nontraumatic subarachnoid hemorrhage, unspecified: Secondary | ICD-10-CM | POA: Diagnosis not present

## 2024-07-03 DIAGNOSIS — Z882 Allergy status to sulfonamides status: Secondary | ICD-10-CM

## 2024-07-03 DIAGNOSIS — Z801 Family history of malignant neoplasm of trachea, bronchus and lung: Secondary | ICD-10-CM | POA: Diagnosis not present

## 2024-07-03 DIAGNOSIS — Z8049 Family history of malignant neoplasm of other genital organs: Secondary | ICD-10-CM | POA: Diagnosis not present

## 2024-07-03 DIAGNOSIS — Z8249 Family history of ischemic heart disease and other diseases of the circulatory system: Secondary | ICD-10-CM

## 2024-07-03 DIAGNOSIS — F17210 Nicotine dependence, cigarettes, uncomplicated: Secondary | ICD-10-CM | POA: Diagnosis present

## 2024-07-03 DIAGNOSIS — Z720 Tobacco use: Secondary | ICD-10-CM | POA: Diagnosis not present

## 2024-07-03 DIAGNOSIS — I608 Other nontraumatic subarachnoid hemorrhage: Secondary | ICD-10-CM | POA: Diagnosis present

## 2024-07-03 DIAGNOSIS — F419 Anxiety disorder, unspecified: Secondary | ICD-10-CM | POA: Diagnosis present

## 2024-07-03 DIAGNOSIS — Z833 Family history of diabetes mellitus: Secondary | ICD-10-CM | POA: Diagnosis not present

## 2024-07-03 DIAGNOSIS — R051 Acute cough: Secondary | ICD-10-CM | POA: Diagnosis not present

## 2024-07-03 LAB — CBC WITH DIFFERENTIAL/PLATELET
Abs Immature Granulocytes: 0.05 K/uL (ref 0.00–0.07)
Basophils Absolute: 0.1 K/uL (ref 0.0–0.1)
Basophils Relative: 0 %
Eosinophils Absolute: 0.2 K/uL (ref 0.0–0.5)
Eosinophils Relative: 2 %
HCT: 39.8 % (ref 36.0–46.0)
Hemoglobin: 13.8 g/dL (ref 12.0–15.0)
Immature Granulocytes: 0 %
Lymphocytes Relative: 9 %
Lymphs Abs: 1.2 K/uL (ref 0.7–4.0)
MCH: 31.9 pg (ref 26.0–34.0)
MCHC: 34.7 g/dL (ref 30.0–36.0)
MCV: 92.1 fL (ref 80.0–100.0)
Monocytes Absolute: 0.6 K/uL (ref 0.1–1.0)
Monocytes Relative: 5 %
Neutro Abs: 10.6 K/uL — ABNORMAL HIGH (ref 1.7–7.7)
Neutrophils Relative %: 84 %
Platelets: 302 K/uL (ref 150–400)
RBC: 4.32 MIL/uL (ref 3.87–5.11)
RDW: 13.9 % (ref 11.5–15.5)
WBC: 12.7 K/uL — ABNORMAL HIGH (ref 4.0–10.5)
nRBC: 0 % (ref 0.0–0.2)

## 2024-07-03 LAB — COMPREHENSIVE METABOLIC PANEL WITH GFR
ALT: 8 U/L (ref 0–44)
AST: 23 U/L (ref 15–41)
Albumin: 4.4 g/dL (ref 3.5–5.0)
Alkaline Phosphatase: 64 U/L (ref 38–126)
Anion gap: 12 (ref 5–15)
BUN: 15 mg/dL (ref 6–20)
CO2: 21 mmol/L — ABNORMAL LOW (ref 22–32)
Calcium: 9.6 mg/dL (ref 8.9–10.3)
Chloride: 104 mmol/L (ref 98–111)
Creatinine, Ser: 0.8 mg/dL (ref 0.44–1.00)
GFR, Estimated: >60 mL/min
Glucose, Bld: 102 mg/dL — ABNORMAL HIGH (ref 70–99)
Potassium: 3.7 mmol/L (ref 3.5–5.1)
Sodium: 137 mmol/L (ref 135–145)
Total Bilirubin: 0.3 mg/dL (ref 0.0–1.2)
Total Protein: 7.1 g/dL (ref 6.5–8.1)

## 2024-07-03 LAB — CBC
HCT: 37.3 % (ref 36.0–46.0)
Hemoglobin: 13 g/dL (ref 12.0–15.0)
MCH: 32.2 pg (ref 26.0–34.0)
MCHC: 34.9 g/dL (ref 30.0–36.0)
MCV: 92.3 fL (ref 80.0–100.0)
Platelets: 299 K/uL (ref 150–400)
RBC: 4.04 MIL/uL (ref 3.87–5.11)
RDW: 13.9 % (ref 11.5–15.5)
WBC: 15.4 K/uL — ABNORMAL HIGH (ref 4.0–10.5)
nRBC: 0 % (ref 0.0–0.2)

## 2024-07-03 LAB — MRSA NEXT GEN BY PCR, NASAL: MRSA by PCR Next Gen: NOT DETECTED

## 2024-07-03 LAB — APTT: aPTT: 28 s (ref 24–36)

## 2024-07-03 LAB — PROTIME-INR
INR: 1 (ref 0.8–1.2)
Prothrombin Time: 13.9 s (ref 11.4–15.2)

## 2024-07-03 LAB — HCG, SERUM, QUALITATIVE: Preg, Serum: NEGATIVE

## 2024-07-03 LAB — HIV ANTIBODY (ROUTINE TESTING W REFLEX): HIV Screen 4th Generation wRfx: NONREACTIVE

## 2024-07-03 MED ORDER — NIMODIPINE 30 MG PO CAPS
60.0000 mg | ORAL_CAPSULE | ORAL | Status: DC
  Administered 2024-07-03 – 2024-07-11 (×47): 60 mg via ORAL
  Filled 2024-07-03 (×47): qty 2

## 2024-07-03 MED ORDER — PROCHLORPERAZINE EDISYLATE 10 MG/2ML IJ SOLN
10.0000 mg | Freq: Once | INTRAMUSCULAR | Status: AC
  Administered 2024-07-03: 10 mg via INTRAVENOUS
  Filled 2024-07-03: qty 2

## 2024-07-03 MED ORDER — PANTOPRAZOLE SODIUM 40 MG IV SOLR
40.0000 mg | Freq: Every day | INTRAVENOUS | Status: DC
  Administered 2024-07-05: 40 mg via INTRAVENOUS
  Filled 2024-07-03: qty 10

## 2024-07-03 MED ORDER — DIPHENHYDRAMINE HCL 50 MG/ML IJ SOLN
12.5000 mg | Freq: Once | INTRAMUSCULAR | Status: AC
  Administered 2024-07-03: 12.5 mg via INTRAVENOUS
  Filled 2024-07-03: qty 1

## 2024-07-03 MED ORDER — ONDANSETRON HCL 4 MG/2ML IJ SOLN
4.0000 mg | Freq: Once | INTRAMUSCULAR | Status: AC
  Administered 2024-07-03: 4 mg via INTRAVENOUS
  Filled 2024-07-03: qty 2

## 2024-07-03 MED ORDER — ACETAMINOPHEN 325 MG PO TABS: 650.0000 mg | ORAL_TABLET | ORAL | Status: DC | PRN

## 2024-07-03 MED ORDER — STROKE: EARLY STAGES OF RECOVERY BOOK
Freq: Once | Status: AC
  Administered 2024-07-04: 1
  Filled 2024-07-03: qty 1

## 2024-07-03 MED ORDER — LEVETIRACETAM (KEPPRA) 500 MG/5 ML ADULT IV PUSH
500.0000 mg | Freq: Two times a day (BID) | INTRAVENOUS | Status: DC
  Administered 2024-07-03 – 2024-07-04 (×2): 500 mg via INTRAVENOUS
  Filled 2024-07-03 (×2): qty 5

## 2024-07-03 MED ORDER — MORPHINE SULFATE (PF) 4 MG/ML IV SOLN
4.0000 mg | Freq: Once | INTRAVENOUS | Status: AC
  Administered 2024-07-03: 4 mg via INTRAVENOUS
  Filled 2024-07-03: qty 1

## 2024-07-03 MED ORDER — ACETAMINOPHEN 650 MG RE SUPP: 650.0000 mg | RECTAL | Status: DC | PRN

## 2024-07-03 MED ORDER — DOCUSATE SODIUM 100 MG PO CAPS
100.0000 mg | ORAL_CAPSULE | Freq: Two times a day (BID) | ORAL | Status: DC
  Administered 2024-07-04 – 2024-07-08 (×7): 100 mg via ORAL
  Filled 2024-07-03 (×7): qty 1

## 2024-07-03 MED ORDER — SODIUM CHLORIDE 0.9 % IV SOLN: INTRAVENOUS | Status: AC

## 2024-07-03 MED ORDER — ONDANSETRON 4 MG PO TBDP
4.0000 mg | ORAL_TABLET | Freq: Four times a day (QID) | ORAL | Status: DC | PRN
  Administered 2024-07-10 – 2024-07-11 (×2): 4 mg via ORAL
  Filled 2024-07-03 (×2): qty 1

## 2024-07-03 MED ORDER — HYDROCODONE-ACETAMINOPHEN 5-325 MG PO TABS
1.0000 | ORAL_TABLET | Freq: Once | ORAL | Status: AC
  Administered 2024-07-03: 1 via ORAL
  Filled 2024-07-03: qty 1

## 2024-07-03 MED ORDER — MORPHINE SULFATE (PF) 2 MG/ML IV SOLN
1.0000 mg | INTRAVENOUS | Status: DC | PRN
  Administered 2024-07-05: 2 mg via INTRAVENOUS
  Filled 2024-07-03: qty 1

## 2024-07-03 MED ORDER — ACETAMINOPHEN-CODEINE 300-30 MG PO TABS
1.0000 | ORAL_TABLET | ORAL | Status: DC | PRN
  Administered 2024-07-03 – 2024-07-04 (×3): 2 via ORAL
  Administered 2024-07-04: 1 via ORAL
  Administered 2024-07-04 – 2024-07-05 (×5): 2 via ORAL
  Administered 2024-07-05: 1 via ORAL
  Administered 2024-07-05 – 2024-07-07 (×12): 2 via ORAL
  Filled 2024-07-03 (×22): qty 2

## 2024-07-03 MED ORDER — ACETAMINOPHEN 160 MG/5ML PO SOLN: 650.0000 mg | ORAL | Status: DC | PRN

## 2024-07-03 MED ORDER — IOHEXOL 350 MG/ML SOLN
75.0000 mL | Freq: Once | INTRAVENOUS | Status: AC | PRN
  Administered 2024-07-03: 75 mL via INTRAVENOUS

## 2024-07-03 MED ORDER — ONDANSETRON HCL 4 MG/2ML IJ SOLN
4.0000 mg | Freq: Four times a day (QID) | INTRAMUSCULAR | Status: DC | PRN
  Administered 2024-07-05 – 2024-07-11 (×8): 4 mg via INTRAVENOUS
  Filled 2024-07-03 (×9): qty 2

## 2024-07-03 MED ORDER — DEXAMETHASONE SOD PHOSPHATE PF 10 MG/ML IJ SOLN
10.0000 mg | Freq: Once | INTRAMUSCULAR | Status: AC
  Administered 2024-07-03: 10 mg via INTRAVENOUS
  Filled 2024-07-03: qty 1

## 2024-07-03 MED ORDER — SODIUM CHLORIDE 0.9 % IV BOLUS
1000.0000 mL | Freq: Once | INTRAVENOUS | Status: AC
  Administered 2024-07-03: 1000 mL via INTRAVENOUS

## 2024-07-03 MED ORDER — NIMODIPINE 6 MG/ML PO SOLN: 60.0000 mg | ORAL | Status: DC

## 2024-07-03 MED ORDER — PANTOPRAZOLE SODIUM 40 MG PO TBEC
40.0000 mg | DELAYED_RELEASE_TABLET | Freq: Every day | ORAL | Status: DC
  Administered 2024-07-03 – 2024-07-11 (×8): 40 mg via ORAL
  Filled 2024-07-03 (×8): qty 1

## 2024-07-03 NOTE — Consult Note (Signed)
 "  NAME:  Lori Bullock, MRN:  [id number], DOB:  Oct 04, 1975, LOS: 0 ADMISSION DATE:  07/03/2024 CONSULTATION DATE:  07/03/2024 REFERRING MD:  Gillie - NSGY CHIEF COMPLAINT:  SAH   History of Present Illness:  49 year old woman who presented to [facility] as a transfer from [facility] 1/6 with sudden onset of HA/neck pain, dizziness. PMHx significant for anxiety, lymphocytic colitis, tobacco abuse. Recently started on estradiol patches.  On presentation to [facility] ED, patient was afebrile with HR 69, BP 129/81, RR 18, SpO2 100%. Labs were notable for WBC 12.7, Hgb 13.8, Plt 302. Na 137, K 3.7, CO2 21, BUN/Cr 15/0.80. LFTs WNL. hCG negative. CTA Head/Neck demonstated perimesencephalic SAH, possibly venous in origin, negative for aneurysm or LVO. Patient's husband states that she recently started a new estradiol patch a few weeks ago and at that time had a headache; she removed the patch and did not replace it until this past Sunday, 1/4 with recurrence of headache on day of admission. Patient describes HA as sudden onset, severe, shooting pain as well as posterior neck pain. She has intermittent nausea and photosensitivity. Pain is improved somewhat with morphine , HA now a 6-7/10. NSGY (Dr. Gillie) admitting.  PCCM consulted for assistance with medical management.  Pertinent Medical History:   Past Medical History:  Diagnosis Date   Anxiety    Lymphocytic colitis    [facility] Events: Including procedures, antibiotic start and stop dates in addition to other pertinent events   1/6 - Presented to [facility] with sudden onset of severe HA + neck pain. CTA Head/Neck +SAH, negative for aneurysm/LVO. Transferred to [facility] for close monitoring and NSGY resources. PCCM consulted.  Interim History / Subjective:  PCCM consulted for medical management.  Objective:  Blood pressure 127/69, pulse 77, temperature (!) 97.5 F (36.4 C), resp. rate 12, SpO2 96%.       No intake or output data in the 24  hours ending 07/03/24 1920 There were no vitals filed for this visit.  Physical Examination: General: Acutely ill-appearing middle-aged woman in NAD. HEENT: [place]/AT, anicteric sclera, PERRL 3mm, moist mucous membranes. Neuro: Awake, eyes closed due to photosensitivity, oriented x 4. Responds to verbal stimuli. Following commands consistently. Moves all 4 extremities spontaneously. No significant strength deficits.  CV: RRR, no m/g/r. PULM: Breathing even and unlabored on RA. Lung fields CTAB. GI: Soft, nontender, nondistended. Normoactive bowel sounds. Extremities: No LE edema noted. Skin: Warm/dry, no rashes.  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 - NSGY primary - Goal SBP < 150 - Cleviprex titrated to goal SBP, not currently needed; can utilize PRN medications prior to gtt initiation - Additional brain imaging per NSGY - Multimodal pain control with Tylenol , morphine ; can consider Fioricet, ensure total APAP no greater than 4g/24H - TCDs per protocol - Nimotop  for vasospasm prevention - Keppra  x 7 days for empiric seizure ppx - Frequent neuro checks - Neuroprotective measures: HOB > 30 degrees, normoglycemia, normothermia, electrolytes WNL - PT/OT/SLP as indicated, patient is at baseline at present  Anxiety - Not on home anxiolytic medications  Tobacco abuse - Encourage cessation  Labs:  CBC: Recent Labs  Lab 07/03/24 1446  WBC 12.7*  NEUTROABS 10.6*  HGB 13.8  HCT 39.8  MCV 92.1  PLT 302   Basic Metabolic Panel: Recent Labs  Lab 07/03/24 1446  NA 137  K 3.7  CL 104  CO2 21*  GLUCOSE 102*  BUN 15  CREATININE 0.80  CALCIUM 9.6   GFR: CrCl cannot be calculated (Unknown ideal weight.). Recent Labs  Lab 07/03/24 1446  WBC 12.7*   Liver Function Tests: Recent Labs  Lab 07/03/24 1446  AST 23  ALT  8  ALKPHOS 64  BILITOT 0.3  PROT 7.1  ALBUMIN 4.4   No results for input(s): LIPASE, AMYLASE in the last 168 hours. No results for input(s): AMMONIA in the last 168 hours.  ABG: No results found for: PHART, PCO2ART, PO2ART, HCO3, TCO2, ACIDBASEDEF, O2SAT   Coagulation Profile: No results for input(s): INR, PROTIME in the last 168 hours.  Cardiac Enzymes: No results for input(s): CKTOTAL, CKMB, CKMBINDEX, TROPONINI in the last 168 hours.  HbA1C: No results found for: HGBA1C  CBG: No results for input(s): GLUCAP in the last 168 hours.  Review of Systems:   Review of systems completed with pertinent positives/negatives outlined in above HPI.  Past Medical History:  She,  has a past medical history of Anxiety and Lymphocytic colitis.   Surgical History:   Past Surgical History:  Procedure Laterality Date   ANKLE FRACTURE SURGERY     Social History:   reports that she has never smoked. She has never used smokeless tobacco. She reports that she does not drink alcohol and does not use drugs.   Family History:  Her family history includes Arthritis in her mother; Cervical cancer in her maternal aunt; Colon polyps in her father; Diabetes in her father and mother; Hypertension in her father and mother; Lung cancer in her paternal grandmother; Prostate cancer in her paternal uncle. There is no history of Colon cancer, Esophageal cancer, Stomach cancer, or Rectal cancer.   Allergies: Allergies[1]   Home Medications: Prior to Admission medications  Medication Sig Start Date End Date Taking? Authorizing Provider  colestipol  (COLESTID ) 1 g tablet Take 2 tablets (2 g total) by mouth 2 (two) times daily. 06/16/21   Abran Norleen SAILOR, MD   Signature:   Corean CHRISTELLA Kamri Gotsch, PA-C [facility] 07/03/2024 7:20 PM  Please see [email or web address] for pager details.  From 7A-7P if no response, please call [phone] After hours, please call  ELink [phone]    [1]  Allergies Allergen Reactions   Sulfonamide Derivatives Anaphylaxis   "

## 2024-07-03 NOTE — H&P (Signed)
 Lori Bullock is an 49 y.o. female.   Chief Complaint: headache HPI: who this afternoon had a severe headache. She felt it on the right side of her head from the forehead to the neck. At [facility] a head ct revealed a SAH and she was transferred for further care. She on her exam was Alert and oriented x 4, speech clear, and no neurological deficits.   Past Medical History:  Diagnosis Date   Anxiety    Lymphocytic colitis    Jakie    Past Surgical History:  Procedure Laterality Date   ANKLE FRACTURE SURGERY      Family History  Problem Relation Age of Onset   Arthritis Mother    Diabetes Mother    Hypertension Mother    Colon polyps Father    Diabetes Father        diet controlled   Hypertension Father    Cervical cancer Maternal Aunt    Prostate cancer Paternal Uncle    Lung cancer Paternal Grandmother    Colon cancer Neg Hx    Esophageal cancer Neg Hx    Stomach cancer Neg Hx    Rectal cancer Neg Hx    Social History:  reports that she has never smoked. She has never used smokeless tobacco. She reports that she does not drink alcohol and does not use drugs.  Allergies: Allergies[1]  Medications Prior to Admission  Medication Sig Dispense Refill   colestipol  (COLESTID ) 1 g tablet Take 2 tablets (2 g total) by mouth 2 (two) times daily. 120 tablet 1    Results for orders placed or performed during the hospital encounter of 07/03/24 (from the past 48 hours)  Comprehensive metabolic panel     Status: Abnormal   Collection Time: 07/03/24  2:46 PM  Result Value Ref Range   Sodium 137 135 - 145 mmol/L   Potassium 3.7 3.5 - 5.1 mmol/L   Chloride 104 98 - 111 mmol/L   CO2 21 (L) 22 - 32 mmol/L   Glucose, Bld 102 (H) 70 - 99 mg/dL    Comment: Glucose reference range applies only to samples taken after fasting for at least 8 hours.   BUN 15 6 - 20 mg/dL   Creatinine, Ser 9.19 0.44 - 1.00 mg/dL   Calcium 9.6 8.9 - 89.6 mg/dL   Total Protein 7.1 6.5 - 8.1 g/dL    Albumin 4.4 3.5 - 5.0 g/dL   AST 23 15 - 41 U/L   ALT 8 0 - 44 U/L   Alkaline Phosphatase 64 38 - 126 U/L   Total Bilirubin 0.3 0.0 - 1.2 mg/dL   GFR, Estimated >39 >39 mL/min    Comment: (NOTE) Calculated using the CKD-EPI Creatinine Equation (2021)    Anion gap 12 5 - 15    Comment: Performed at [facility], [address], [place], [place] [address]  CBC with Differential     Status: Abnormal   Collection Time: 07/03/24  2:46 PM  Result Value Ref Range   WBC 12.7 (H) 4.0 - 10.5 K/uL   RBC 4.32 3.87 - 5.11 MIL/uL   Hemoglobin 13.8 12.0 - 15.0 g/dL   HCT 60.1 63.9 - 53.9 %   MCV 92.1 80.0 - 100.0 fL   MCH 31.9 26.0 - 34.0 pg   MCHC 34.7 30.0 - 36.0 g/dL   RDW 86.0 88.4 - 84.4 %   Platelets 302 150 - 400 K/uL   nRBC 0.0 0.0 - 0.2 %  Neutrophils Relative % 84 %   Neutro Abs 10.6 (H) 1.7 - 7.7 K/uL   Lymphocytes Relative 9 %   Lymphs Abs 1.2 0.7 - 4.0 K/uL   Monocytes Relative 5 %   Monocytes Absolute 0.6 0.1 - 1.0 K/uL   Eosinophils Relative 2 %   Eosinophils Absolute 0.2 0.0 - 0.5 K/uL   Basophils Relative 0 %   Basophils Absolute 0.1 0.0 - 0.1 K/uL   Immature Granulocytes 0 %   Abs Immature Granulocytes 0.05 0.00 - 0.07 K/uL    Comment: Performed at [facility], [address] [place], [place] [address]  hCG, serum, qualitative     Status: None   Collection Time: 07/03/24  4:32 PM  Result Value Ref Range   Preg, Serum NEGATIVE NEGATIVE    Comment:        THE SENSITIVITY OF THIS METHODOLOGY IS >10 mIU/mL. Performed at [facility], [address], [place], [place] [address]   CBC     Status: Abnormal   Collection Time: 07/03/24  7:53 PM  Result Value Ref Range   WBC 15.4 (H) 4.0 - 10.5 K/uL   RBC 4.04 3.87 - 5.11 MIL/uL   Hemoglobin 13.0 12.0 - 15.0 g/dL   HCT 62.6 63.9 - 53.9 %   MCV 92.3 80.0 - 100.0 fL   MCH 32.2 26.0 - 34.0 pg   MCHC 34.9 30.0 - 36.0 g/dL   RDW 86.0 88.4 - 84.4 %   Platelets  299 150 - 400 K/uL   nRBC 0.0 0.0 - 0.2 %    Comment: Performed at [facility], [address], [place], [place] [address]  Protime-INR     Status: None   Collection Time: 07/03/24  7:53 PM  Result Value Ref Range   Prothrombin Time 13.9 11.4 - 15.2 seconds   INR 1.0 0.8 - 1.2    Comment: (NOTE) INR goal varies based on device and disease states. Performed at [facility], [address] [place], [place] [address]   APTT     Status: None   Collection Time: 07/03/24  7:53 PM  Result Value Ref Range   aPTT 28 24 - 36 seconds    Comment: Performed at [facility], [address] [place], [place] [address]     Review of Systems  Blood pressure 127/69, pulse 77, temperature 99.1 F (37.3 C), temperature source Oral, resp. rate 12, SpO2 96%. Physical Exam Constitutional:      Appearance: She is well-developed. She is not ill-appearing.  HENT:     Head: Normocephalic and atraumatic.     Mouth/Throat:     Mouth: Mucous membranes are moist.     Pharynx: Oropharynx is clear.  Eyes:     General: No visual field deficit.    Extraocular Movements: Extraocular movements intact.     Pupils: Pupils are equal, round, and reactive to light.  Cardiovascular:     Rate and Rhythm: Normal rate and regular rhythm.  Pulmonary:     Effort: Pulmonary effort is normal.     Breath sounds: Normal breath sounds.  Abdominal:     Palpations: Abdomen is soft.  Musculoskeletal:        General: Normal range of motion.  Skin:    General: Skin is warm and dry.  Neurological:     Mental Status: She is alert and oriented to person, place, and time.     Cranial Nerves: No cranial  nerve deficit, dysarthria or facial asymmetry.     Sensory: No sensory deficit.     Motor: No weakness.     Coordination: Coordination normal.     Deep Tendon Reflexes: Reflexes normal. Babinski sign absent on the right side. Babinski sign absent on the left side.  Psychiatric:        Mood and Affect:  Mood normal.        Speech: Speech normal.        Behavior: Behavior normal.   CT Angio Head Neck W WO CM Addendum Date: 07/03/2024  ADDENDUM #1 ADDENDUM: There is a 1.5 cm nodule posterior to the left lobe of the thyroid. Recommend non-emergent sonographic follow-up. This finding will be communicated to the ordering provider via a virtual radiology assistant. Electronically signed by: Prentice Spade MD 07/03/2024 05:01 PM EST RP Workstation: [id number]   Result Date: 07/03/2024  ORIGINAL REPORT EXAM: CT HEAD WITHOUT CTA HEAD AND NECK WITH AND WITHOUT 07/03/2024 03:50:32 PM TECHNIQUE: CTA of the head and neck was performed with and without the administration of intravenous contrast. Noncontrast CT of the head with reconstructed 2-D images are also provided for review. Multiplanar 2D and/or 3D reformatted images are provided for review. Automated exposure control, iterative reconstruction, and/or weight based adjustment of the mA/kV was utilized to reduce the radiation dose to as low as reasonably achievable. COMPARISON: None available CLINICAL HISTORY: Headache Headache FINDINGS: BRAIN AND VENTRICLES: There is hyperattenuation within the interpedicular, perimesencephalic, prepontine, and premedullary cisterns suspicious for subarachnoid hemorrhage. Mild scattered white matter hypodensities which are nonspecific but most commonly represent chronic microvascular ischemic changes. No mass effect. No midline shift. No evidence of acute territorial infarct. No hydrocephalus. ORBITS: No acute abnormality. SINUSES AND MASTOIDS: Scattered ethmoid air cells opacification. AORTIC ARCH AND ARCH VESSELS: No dissection or arterial injury. No significant stenosis of the brachiocephalic or subclavian arteries. CERVICAL CAROTID ARTERIES: No dissection, arterial injury, or hemodynamically significant stenosis by NASCET criteria. CERVICAL VERTEBRAL ARTERIES: No dissection, arterial injury, or significant stenosis. LUNGS AND  MEDIASTINUM: Unremarkable. SOFT TISSUES: No acute abnormality. BONES: No acute abnormality. CTA HEAD: ANTERIOR CIRCULATION: No significant stenosis of the internal carotid arteries. No significant stenosis of the anterior cerebral arteries. No significant stenosis of the middle cerebral arteries. No aneurysm. POSTERIOR CIRCULATION: No significant stenosis of the posterior cerebral arteries. No significant stenosis of the basilar artery. No significant stenosis of the vertebral arteries. No aneurysm. OTHER: IMPRESSION: 1. 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 2. CTA: No aneurysm. 3. The above findings were related to the Dr. Ocie at 4:30 PM. 4. No large vessel occlusion in the head or neck. Electronically signed by: Prentice Spade MD 07/03/2024 04:48 PM EST RP Workstation: [id number]      Assessment/Plan Lonetta Blassingame is a 49 y.o. female With a hunt hess of 1. CT angio performed today was negative for aneurysm. She is a classic prepontine hemorrhage. Repeat angio in 4-5 days.   Rockey Peru, MD 07/03/2024, 8:53 PM       [1]  Allergies Allergen Reactions   Sulfonamide Derivatives Anaphylaxis

## 2024-07-03 NOTE — ED Triage Notes (Signed)
 Neck pain  Headache Shooting pain into head Started Sunday Started on estrogen patches

## 2024-07-03 NOTE — Progress Notes (Incomplete)
 Oral hx from patient and husband:  Pt was on estrogen patches for 2 weeks and in the first couple of days experienced horrible headache episode, d/c for 1 week while out of town, re-applied patch on Sunday 1/4 and HA returned.   Pt had botox injections on 12/10.

## 2024-07-03 NOTE — ED Notes (Signed)
 [facility] at bedside

## 2024-07-03 NOTE — ED Provider Notes (Signed)
 " [facility] Provider Note   CSN: [id number] Arrival date & time: 07/03/24  1407     Patient presents with: Headache  HPI Lori Bullock is a 49 y.o. female presenting for headache. Started all of a sudden about 2 hours before arrival.  Pain is in the posterior right side of the head and extends to the forehead and down her neck.  Describes it as a shooting pain. Denies visual disturbance but endorsing some sensitivity to light.  Reports that she does smoke about 1/2 pack of cigarettes a day.    Headache      Prior to Admission medications  Medication Sig Start Date End Date Taking? Authorizing Provider  estradiol (CLIMARA - DOSED IN MG/24 HR) 0.0375 mg/24hr patch Place 0.0375 mg onto the skin once a week.   Yes [provider]  levonorgestrel (MIRENA) 20 MCG/DAY IUD 1 each by Intrauterine route once.   Yes [provider]  colestipol  (COLESTID ) 1 g tablet Take 2 tablets (2 g total) by mouth 2 (two) times daily. Patient not taking: Reported on 07/03/2024 06/16/21   Abran Norleen SAILOR, MD    Allergies: Sulfonamide derivatives    Review of Systems  Neurological:  Positive for headaches.    Updated Vital Signs BP 117/64   Pulse 87   Temp 98.1 F (36.7 C) (Oral)   Resp 18   SpO2 97%   Physical Exam Vitals and nursing note reviewed.  Constitutional:      Comments: Appears debilitated by her headache.  Eyes are closed with a blanket over her head in a dark room.    HENT:     Head: Normocephalic and atraumatic.     Mouth/Throat:     Mouth: Mucous membranes are moist.  Eyes:     General:        Right eye: No discharge.        Left eye: No discharge.     Conjunctiva/sclera: Conjunctivae normal.  Cardiovascular:     Rate and Rhythm: Normal rate and regular rhythm.     Pulses: Normal pulses.     Heart sounds: Normal heart sounds.  Pulmonary:     Effort: Pulmonary effort is normal.     Breath sounds: Normal  breath sounds.  Abdominal:     General: Abdomen is flat.     Palpations: Abdomen is soft.  Skin:    General: Skin is warm and dry.  Neurological:     General: No focal deficit present.     Comments: GCS 15. Speech is goal oriented. No deficits appreciated to CN III-XII; symmetric eyebrow raise, no facial drooping, tongue midline. Patient has equal grip strength bilaterally with 5/5 strength against resistance in all major muscle groups bilaterally. Sensation to light touch intact. Patient moves extremities without ataxia. Normal finger-nose-finger. Patient ambulatory with steady gait.  Psychiatric:        Mood and Affect: Mood normal.     (all labs ordered are listed, but only abnormal results are displayed) Labs Reviewed  COMPREHENSIVE METABOLIC PANEL WITH GFR - Abnormal; Notable for the following components:      Result Value   CO2 21 (*)    Glucose, Bld 102 (*)    All other components within normal limits  CBC WITH DIFFERENTIAL/PLATELET - Abnormal; Notable for the following components:   WBC 12.7 (*)    Neutro Abs 10.6 (*)    All other components within normal limits  CBC - Abnormal;  Notable for the following components:   WBC 15.4 (*)    All other components within normal limits  MRSA NEXT GEN BY PCR, NASAL  HCG, SERUM, QUALITATIVE  HIV ANTIBODY (ROUTINE TESTING W REFLEX)  PROTIME-INR  APTT    EKG: None  Radiology: VAS US  TRANSCRANIAL DOPPLER Result Date: 07/04/2024  Transcranial Doppler Patient Name:  Lori Bullock  Date of Exam:   07/04/2024 Medical Rec #: [id number]            Accession #:    [id number] Date of Birth: [birth year]           Patient Gender: F Patient Age:   34 years Exam Location:  [facility] Procedure:      VAS US  TRANSCRANIAL DOPPLER Referring Phys: ROCKEY CABBELL --------------------------------------------------------------------------------  Indications: Subarachnoid hemorrhage. Comparison Study: No previous exams Performing Technologist:  Jody Hill RVT, RDMS  Examination Guidelines: A complete evaluation includes B-mode imaging, spectral Doppler, color Doppler, and power Doppler as needed of all accessible portions of each vessel. Bilateral testing is considered an integral part of a complete examination. Limited examinations for reoccurring indications may be performed as noted.  +----------+---------------+----------+-----------+-------+ RIGHT TCD Right VM (cm/s)Depth (cm)PulsatilityComment +----------+---------------+----------+-----------+-------+ MCA             42                    0.93            +----------+---------------+----------+-----------+-------+ ACA             -24                   1.25            +----------+---------------+----------+-----------+-------+ Term ICA        35                    1.06            +----------+---------------+----------+-----------+-------+ PCA P1          33                    0.79            +----------+---------------+----------+-----------+-------+ Opthalmic       20                    1.47            +----------+---------------+----------+-----------+-------+ ICA siphon      47                    1.05            +----------+---------------+----------+-----------+-------+ Vertebral       -40                   0.96            +----------+---------------+----------+-----------+-------+ Distal ICA      21                    0.85            +----------+---------------+----------+-----------+-------+  +----------+--------------+----------+-----------+------------------+ LEFT TCD  Left VM (cm/s)Depth (cm)Pulsatility     Comment       +----------+--------------+----------+-----------+------------------+ MCA             53                   0.90                       +----------+--------------+----------+-----------+------------------+  ACA                                          unable to insonate  +----------+--------------+----------+-----------+------------------+ Term ICA        52                   0.87                       +----------+--------------+----------+-----------+------------------+ PCA P1          25                   0.91                       +----------+--------------+----------+-----------+------------------+ Opthalmic       24                   1.54                       +----------+--------------+----------+-----------+------------------+ ICA siphon      32                   0.99                       +----------+--------------+----------+-----------+------------------+ Vertebral      -41                   1.09                       +----------+--------------+----------+-----------+------------------+ Distal ICA      32                   1.03                       +----------+--------------+----------+-----------+------------------+  +------------+-------+-------+             VM cm/sComment +------------+-------+-------+ Prox Basilar  -53   0.92   +------------+-------+-------+ Dist Basilar  -42   0.92   +------------+-------+-------+ +----------------------+----+ Right Lindegaard Ratio2.00 +----------------------+----+ +---------------------+----+ Left Lindegaard Ratio1.66 +---------------------+----+    Preliminary    CT Angio Head Neck W WO CM Addendum Date: 07/03/2024 ** ADDENDUM #1 ** ADDENDUM: There is a 1.5 cm nodule posterior to the left lobe of the thyroid. Recommend non-emergent sonographic follow-up. This finding will be communicated to the ordering provider via a virtual radiology assistant. Electronically signed by: Prentice Spade MD 07/03/2024 05:01 PM EST RP Workstation: [id number]   Result Date: 07/03/2024 ** ORIGINAL REPORT ** EXAM: CT HEAD WITHOUT CTA HEAD AND NECK WITH AND WITHOUT 07/03/2024 03:50:32 PM TECHNIQUE: CTA of the head and neck was performed with and without the administration of intravenous contrast.  Noncontrast CT of the head with reconstructed 2-D images are also provided for review. Multiplanar 2D and/or 3D reformatted images are provided for review. Automated exposure control, iterative reconstruction, and/or weight based adjustment of the mA/kV was utilized to reduce the radiation dose to as low as reasonably achievable. COMPARISON: None available CLINICAL HISTORY: Headache Headache FINDINGS: BRAIN AND VENTRICLES: There is hyperattenuation within the interpedicular, perimesencephalic, prepontine, and premedullary cisterns suspicious for subarachnoid hemorrhage. Mild scattered white matter hypodensities which are nonspecific but most commonly represent chronic microvascular ischemic changes. No mass  effect. No midline shift. No evidence of acute territorial infarct. No hydrocephalus. ORBITS: No acute abnormality. SINUSES AND MASTOIDS: Scattered ethmoid air cells opacification. AORTIC ARCH AND ARCH VESSELS: No dissection or arterial injury. No significant stenosis of the brachiocephalic or subclavian arteries. CERVICAL CAROTID ARTERIES: No dissection, arterial injury, or hemodynamically significant stenosis by NASCET criteria. CERVICAL VERTEBRAL ARTERIES: No dissection, arterial injury, or significant stenosis. LUNGS AND MEDIASTINUM: Unremarkable. SOFT TISSUES: No acute abnormality. BONES: No acute abnormality. CTA HEAD: ANTERIOR CIRCULATION: No significant stenosis of the internal carotid arteries. No significant stenosis of the anterior cerebral arteries. No significant stenosis of the middle cerebral arteries. No aneurysm. POSTERIOR CIRCULATION: No significant stenosis of the posterior cerebral arteries. No significant stenosis of the basilar artery. No significant stenosis of the vertebral arteries. No aneurysm. OTHER: IMPRESSION: 1. 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 2. CTA: No aneurysm. 3. The above findings were related to the Dr. Ocie at 4:30 PM. 4. No large  vessel occlusion in the head or neck. Electronically signed by: Prentice Spade MD 07/03/2024 04:48 PM EST RP Workstation: [id number]     .Critical Care  Performed by: Lang Norleen POUR, PA-C Authorized by: Lang Norleen POUR, PA-C   Critical care provider statement:    Critical care time (minutes):  30   Critical care was necessary to treat or prevent imminent or life-threatening deterioration of the following conditions: Subarachnoid hemorrhage.   Critical care was time spent personally by me on the following activities:  Development of treatment plan with patient or surrogate, discussions with consultants, evaluation of patient's response to treatment, examination of patient, ordering and review of laboratory studies, ordering and review of radiographic studies, ordering and performing treatments and interventions, pulse oximetry, re-evaluation of patient's condition and review of old charts    Medications Ordered in the ED  0.9 %  sodium chloride  infusion ( Intravenous Infusion Verify 07/04/24 0900)  acetaminophen  (TYLENOL ) tablet 650 mg (has no administration in time range)    Or  acetaminophen  (TYLENOL ) 160 MG/5ML solution 650 mg (has no administration in time range)    Or  acetaminophen  (TYLENOL ) suppository 650 mg (has no administration in time range)  docusate sodium  (COLACE) capsule 100 mg (100 mg Oral Given 07/04/24 1005)  ondansetron  (ZOFRAN -ODT) disintegrating tablet 4 mg (has no administration in time range)    Or  ondansetron  (ZOFRAN ) injection 4 mg (has no administration in time range)  niMODipine  (NIMOTOP ) capsule 60 mg (60 mg Oral Given 07/04/24 1206)    Or  niMODipine  (NYMALIZE ) 6 MG/ML oral solution 60 mg ( Per Tube See Alternative 07/04/24 1206)  pantoprazole  (PROTONIX ) EC tablet 40 mg (40 mg Oral Given 07/04/24 1005)    Or  pantoprazole  (PROTONIX ) injection 40 mg ( Intravenous See Alternative 07/04/24 1005)  acetaminophen -codeine  (TYLENOL  #3) 300-30 MG per tablet 1-2 tablet (1  tablet Oral Given 07/04/24 1210)  morphine  (PF) 2 MG/ML injection 1-4 mg (has no administration in time range)  Chlorhexidine  Gluconate Cloth 2 % PADS 6 each (has no administration in time range)  Oral care mouth rinse (has no administration in time range)  levETIRAcetam  (KEPPRA ) tablet 500 mg (has no administration in time range)  sodium chloride  0.9 % bolus 1,000 mL (0 mLs Intravenous Stopped 07/03/24 1608)  prochlorperazine  (COMPAZINE ) injection 10 mg (10 mg Intravenous Given 07/03/24 1450)  diphenhydrAMINE  (BENADRYL ) injection 12.5 mg (12.5 mg Intravenous Given 07/03/24 1451)  iohexol  (OMNIPAQUE ) 350 MG/ML injection 75 mL (75 mLs Intravenous Contrast Given 07/03/24 1542)  dexamethasone  (DECADRON )  injection 10 mg (10 mg Intravenous Given 07/03/24 1630)  HYDROcodone -acetaminophen  (NORCO/VICODIN) 5-325 MG per tablet 1 tablet (1 tablet Oral Given 07/03/24 1629)  ondansetron  (ZOFRAN ) injection 4 mg (4 mg Intravenous Given 07/03/24 1651)  morphine  (PF) 4 MG/ML injection 4 mg (4 mg Intravenous Given 07/03/24 1650)   stroke: early stages of recovery book (1 each Does not apply Given 07/04/24 1005)  potassium chloride  (KLOR-CON  M) CR tablet 30 mEq (30 mEq Oral Given 07/04/24 0820)    Clinical Course as of 07/04/24 1423  Tue Jul 03, 2024  1649 Basophil: 0 [JR]    Clinical Course User Index [JR] Lang Norleen POUR, PA-C                                 Medical Decision Making Amount and/or Complexity of Data Reviewed Labs: ordered. Decision-making details documented in ED Course. Radiology: ordered.  Risk Prescription drug management.   Initial Impression and Ddx 49 year old well-appearing female but in pain and uncomfortable due to her headache.  Exam notable for general discomfort but no focal neurodeficit and otherwise reassuring.  DDx includes subarachnoid hemorrhage, cerebral venous thrombosis, hypertensive emergency, other stroke, electrolyte derangement, other. Patient PMH that increases complexity of ED  encounter: Smoker  Interpretation of Diagnostics - I independent reviewed and interpreted the labs as followed: White blood cell count 12.7  - I independently visualized the following imaging with scope of interpretation limited to determining acute life threatening conditions related to emergency care: CT angio head neck, which revealed concerns for subarachnoid hemorrhage without associated evidence of aneurysmal rupture  Patient Reassessment and Ultimate Disposition/Management Workup suggestive of atraumatic subarachnoid hemorrhage. Discussed patient with Dr. Gillie of neurosurgery.  He advised for transfer to [facility] to the neuro ICU.  He agreed to accept her for the transfer.  On reassessment she was still in a considerable amount of pain gave Decadron  and morphine  with Zofran .  She has remained hemodynamically stable on room air.  Transferred to [facility] via [facility].   Patient management required discussion with the following services or consulting groups:  Neurosurgery  Complexity of Problems Addressed Acute complicated illness or Injury  Additional Data Reviewed and Analyzed Further history obtained from: Past medical history and medications listed in the EMR and Prior ED visit notes  Patient Encounter Risk Assessment Consideration of hospitalization      Final diagnoses:  Subarachnoid hemorrhage [facility])    ED Discharge Orders     None          Lang Norleen POUR, PA-C 07/04/24 1423    Pamella Ozell LABOR, DO 07/07/24 [id number]  "

## 2024-07-03 NOTE — ED Provider Triage Note (Addendum)
 Emergency Medicine Provider Triage Evaluation Note  Lori Bullock , a 49 y.o. female  was evaluated in triage.  Pt complains of pain in the back of the head, feeling dizzy, light headed, headache. No recent illness, falls/injuries, has not been to the chiropractor. No weakness or numbness. Recently started estradiol patch.   Review of Systems  Positive:  Negative:   Physical Exam  BP 130/85 (BP Location: Right Arm)   Pulse 96   Temp (!) 97.5 F (36.4 C)   Resp 17   SpO2 100%  Gen:   Awake, no distress   Resp:  Normal effort  MSK:   Moves extremities without difficulty  Other:  No pain with palpation of neck, equal extremity strength, sensory intact   Medical Decision Making  Medically screening exam initiated at 2:25 PM.  Appropriate orders placed.  Lori Bullock was informed that the remainder of the evaluation will be completed by another provider, this initial triage assessment does not replace that evaluation, and the importance of remaining in the ED until their evaluation is complete.     Beverley Leita LABOR, PA-C 07/03/24 1426    Beverley Leita LABOR, PA-C 07/03/24 1426

## 2024-07-04 ENCOUNTER — Inpatient Hospital Stay (HOSPITAL_COMMUNITY)

## 2024-07-04 DIAGNOSIS — I609 Nontraumatic subarachnoid hemorrhage, unspecified: Secondary | ICD-10-CM | POA: Diagnosis not present

## 2024-07-04 MED ORDER — ORAL CARE MOUTH RINSE: 15.0000 mL | OROMUCOSAL | Status: DC | PRN

## 2024-07-04 MED ORDER — POTASSIUM CHLORIDE CRYS ER 20 MEQ PO TBCR
30.0000 meq | EXTENDED_RELEASE_TABLET | Freq: Once | ORAL | Status: AC
  Administered 2024-07-04: 30 meq via ORAL
  Filled 2024-07-04: qty 1

## 2024-07-04 MED ORDER — CHLORHEXIDINE GLUCONATE CLOTH 2 % EX PADS
6.0000 | MEDICATED_PAD | Freq: Every day | CUTANEOUS | Status: DC
  Administered 2024-07-04 – 2024-07-10 (×6): 6 via TOPICAL

## 2024-07-04 MED ORDER — LEVETIRACETAM 500 MG PO TABS
500.0000 mg | ORAL_TABLET | Freq: Two times a day (BID) | ORAL | Status: DC
  Administered 2024-07-04 – 2024-07-09 (×10): 500 mg via ORAL
  Filled 2024-07-04 (×10): qty 1

## 2024-07-04 NOTE — Plan of Care (Signed)
" °  Problem: Acute Rehab OT Goals (only OT should resolve) Goal: Pt. Will Perform Upper Body Bathing Flowsheets (Taken 07/04/2024 1319) Pt Will Perform Upper Body Bathing: with modified independence Goal: Pt. Will Perform Lower Body Bathing Flowsheets (Taken 07/04/2024 1319) Pt Will Perform Lower Body Bathing: with modified independence Goal: Pt. Will Perform Lower Body Dressing Flowsheets (Taken 07/04/2024 1319) Pt Will Perform Lower Body Dressing: with modified independence Goal: Pt. Will Transfer To Toilet Flowsheets (Taken 07/04/2024 1319) Pt Will Transfer to Toilet: with modified independence Goal: OT Additional ADL Goal #1 Flowsheets (Taken 07/04/2024 1319) Additional ADL Goal #1: patient will demonstrate ability to complete ADL routine with no more than 5/10 pain in neck and/ or dizziness , with mod I, to increase safety in home environment   "

## 2024-07-04 NOTE — TOC CM/SW Note (Signed)
 Transition of Care [facility]) - Inpatient Brief Assessment   Patient Details  Name: Lori Bullock MRN: [id number] Date of Birth: 09-Nov-1975  Transition of Care [facility]) CM/SW Contact:    Marbin Olshefski M, RN Phone Number: 07/04/2024, 4:08 PM   Clinical Narrative: 49 yo F adm 1/6 from [facility] with severe HA. Imaging revealed prepontine SAH.  PT/OT recommending no OP follow up.  Currently on Nimotop  for vasospasm prevention.    Transition of Care Asessment: Insurance and Status: Insurance coverage has been reviewed Patient has primary care physician: Yes Home environment has been reviewed: Lives with spouse and children Prior level of function:: Independent Prior/Current Home Services: No current home services Social Drivers of Health Review: SDOH reviewed no interventions necessary Readmission risk has been reviewed: Yes Transition of care needs: no transition of care needs at this time  Mliss MICAEL Fass, RN, BSN  Trauma/Neuro ICU Case Manager [phone]

## 2024-07-04 NOTE — Evaluation (Signed)
 Occupational Therapy Evaluation Patient Details Name: Lori Bullock MRN: [id number] DOB: March 03, 1976 Today's Date: 07/04/2024   History of Present Illness   49 yo F adm 1/6 from [facility] with severe HA. Imaging revealed prepontine SAH. PMH: anxiety, lymphocytic colitis, tobacco abuse     Clinical Impressions Patient received in room in bed. 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 Patient currently functions at overall CGA to SUP level with functional mobility and ADLs. Patient reports pain in neck with functional mobility leading to decreased ability to scan and continue functional mobility. Patient overall reports increased performance vs prior therapies with pain medication administered for R sided neck pain. Patient demonstrates good static and dynamic sitting/ standing balance, bed mobility, functional transfers, and ability to complete ADLs.      If plan is discharge home, recommend the following:   Assistance with cooking/housework;A little help with walking and/or transfers;A little help with bathing/dressing/bathroom (SUP)     Functional Status Assessment   Patient has had a recent decline in their functional status and demonstrates the ability to make significant improvements in function in a reasonable and predictable amount of time.     Equipment Recommendations   None recommended by OT     Recommendations for Other Services         Precautions/Restrictions   Precautions Precautions: Fall Precaution/Restrictions Comments: severe HA Restrictions Weight Bearing Restrictions Per Provider Order: No     Mobility Bed Mobility Overal bed mobility: Modified Independent             General bed mobility comments: HOB elevated, increased time, no use of bed rails    Transfers Overall transfer level: Needs assistance Equipment used: None Transfers: Sit to/from Stand Sit to Stand: Contact guard  assist           General transfer comment: sup      Balance Overall balance assessment: Mild deficits observed, not formally tested (very mild level of deficits, SUP level. patient limited by lines / leads)                                         ADL either performed or assessed with clinical judgement   ADL Overall ADL's : Needs assistance/impaired                 Upper Body Dressing : Supervision/safety       Toilet Transfer: Supervision/safety           Functional mobility during ADLs: Supervision/safety General ADL Comments: patient functioning at overall modified independent to sup level with no changes with bending. patient has some pain wih turning however can manage with speed. patient reports feeling closer to baseline. per PT session, patient at mod I level for toileting     Vision Baseline Vision/History: 1 Wears glasses Ability to See in Adequate Light: 0 Adequate Patient Visual Report: No change from baseline Vision Assessment?: No apparent visual deficits     Perception Perception: Within Functional Limits       Praxis Praxis: WFL       Pertinent Vitals/Pain Pain Assessment Pain Assessment: 0-10 Pain Score: 4  Pain Location: neck into head, small increase with mobility however reports feeling much better Pain Descriptors / Indicators: Headache Pain Intervention(s): Premedicated before session     Extremity/Trunk Assessment Upper Extremity Assessment Upper Extremity Assessment: Overall  WFL for tasks assessed   Lower Extremity Assessment Lower Extremity Assessment: Overall WFL for tasks assessed   Cervical / Trunk Assessment Cervical / Trunk Assessment: Other exceptions Cervical / Trunk Exceptions: stiff neck limiting AROM   Communication Communication Communication: No apparent difficulties   Cognition Arousal: Alert Behavior During Therapy: WFL for tasks assessed/performed Cognition: No apparent impairments                                Following commands: Intact       Cueing  General Comments   Cueing Techniques: Verbal cues  patient demonstrating good balance, moobility, self care skills during session. reports medication helped with pain which was limiting functional mobility.   Exercises     Shoulder Instructions      Home Living Family/patient expects to be discharged to:: Private residence Living Arrangements: Spouse/significant other;Children Available Help at Discharge: Family;Available 24 hours/day Type of Home: House Home Access: Stairs to enter [employer] of Steps: 2 Entrance Stairs-Rails: None Home Layout: Two level Alternate Level Stairs-Number of Steps: flight Alternate Level Stairs-Rails: Left Bathroom Shower/Tub: Producer, Television/film/video: Standard Bathroom Accessibility: Yes   Home Equipment: Shower seat;Grab bars - tub/shower          Prior Functioning/Environment Prior Level of Function : Independent/Modified Independent;Working/employed;Driving             Mobility Comments: works as a firefighter, 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    OT Problem List: Impaired balance (sitting and/or standing);Decreased activity tolerance   OT Treatment/Interventions: Self-care/ADL training;DME and/or AE instruction;Therapeutic activities;Patient/family education;Balance training;Cognitive remediation/compensation;Visual/perceptual remediation/compensation      OT Goals(Current goals can be found in the care plan section)   Acute Rehab OT Goals Patient Stated Goal: feel better go home OT Goal Formulation: With patient Time For Goal Achievement: 07/18/24 Potential to Achieve Goals: Good   OT Frequency:  Min 1X/week (1 more session)    Co-evaluation              AM-PAC OT 6 Clicks Daily Activity     Outcome Measure Help from another person eating meals?: None Help from another person taking care of personal  grooming?: None Help from another person toileting, which includes using toliet, bedpan, or urinal?: None Help from another person bathing (including washing, rinsing, drying)?: None Help from another person to put on and taking off regular upper body clothing?: None Help from another person to put on and taking off regular lower body clothing?: None 6 Click Score: 24   End of Session Equipment Utilized During Treatment: Gait belt Nurse Communication: Mobility status;Other (comment) (patient current performance/ functional level following pain medication)  Activity Tolerance: Patient tolerated treatment well Patient left: in bed;with bed alarm set  OT Visit Diagnosis: Muscle weakness (generalized) (M62.81)                Time: 9062-8995 OT Time Calculation (min): 27 min Charges:  OT General Charges $OT Visit: 1 Visit OT Evaluation $OT Eval Low Complexity: 1 Low OT Treatments $Therapeutic Activity: 8-22 mins D'mariea Charmaine Placido OTD, OTR/L   D'mariea L Maurya Nethery 07/04/2024, 1:19 PM

## 2024-07-04 NOTE — Progress Notes (Signed)
 "  NAME:  Lori Bullock, MRN:  [id number], DOB:  1976/04/30, LOS: 1 ADMISSION DATE:  07/03/2024 CONSULTATION DATE:  07/03/2024 REFERRING MD:  Gillie - NSGY CHIEF COMPLAINT:  SAH   History of Present Illness:  49 year old woman who presented to [facility] as a transfer from [facility] 1/6 with sudden onset of HA/neck pain, dizziness. PMHx significant for anxiety, lymphocytic colitis, tobacco abuse. Recently started on estradiol patches.  On presentation to [facility] ED, patient was afebrile with HR 69, BP 129/81, RR 18, SpO2 100%. Labs were notable for WBC 12.7, Hgb 13.8, Plt 302. Na 137, K 3.7, CO2 21, BUN/Cr 15/0.80. LFTs WNL. hCG negative. CTA Head/Neck demonstated perimesencephalic SAH, possibly venous in origin, negative for aneurysm or LVO. Patient's husband states that she recently started a new estradiol patch a few weeks ago and at that time had a headache; she removed the patch and did not replace it until this past Sunday, 1/4 with recurrence of headache on day of admission. Patient describes HA as sudden onset, severe, shooting pain as well as posterior neck pain. She has intermittent nausea and photosensitivity. Pain is improved somewhat with morphine , HA now a 6-7/10. NSGY (Dr. Gillie) admitting.  PCCM consulted for assistance with medical management.  Pertinent Medical History:   Past Medical History:  Diagnosis Date   Anxiety    Lymphocytic colitis    [facility] Events: Including procedures, antibiotic start and stop dates in addition to other pertinent events   1/6 - Presented to [facility] with sudden onset of severe HA + neck pain. CTA Head/Neck +SAH, negative for aneurysm/LVO. Transferred to [facility] for close monitoring and NSGY resources. PCCM consulted.  Interim History / Subjective:  NAEO. Ambulating hallways without difficulty this morning. Reports continued HA, although improved Continue vasospasm watch per Nsgy; patient reports she was told she will likely discharge in 4  days  Objective:  Blood pressure 104/64, pulse 62, temperature 98.3 F (36.8 C), temperature source Oral, resp. rate 14, SpO2 96%.        Intake/Output Summary (Last 24 hours) at 07/04/2024 0728 Last data filed at 07/04/2024 0500 Gross per 24 hour  Intake 1121.27 ml  Output --  Net 1121.27 ml   There were no vitals filed for this visit.  Physical Examination: General: acutely-ill woman lying in bed, in NAD HEENT: AT/[place], PERRL, 3mm bilaterally, wears glasses Pulm: normal inspiratory and expiratory effort on RA. CTAB CV: RRR, no m/g/r GI: soft, non distended, non tender to palpation Neuro: A&O x3, no focal deficits, sensory/motor intact  Resolved Hospital Problem List:    Assessment & Plan:  Subarachnoid hemorrhage, non-aneurysmal CTA Head/Neck 1/6 perimesencephalic SAH, possibly venous in origin, negative for aneurysm or LVO.. - NSGY primary - Goal SBP < 150 - Additional brain imaging per NSGY - Multimodal pain control with Tylenol , morphine ; can consider Fioricet, ensure total APAP no greater than 4g/24H - TCDs per protocol - Nimotop  for vasospasm prevention - Keppra  x 7 days for empiric seizure ppx - Frequent neuro checks - Neuroprotective measures: HOB > 30 degrees, normoglycemia, normothermia, electrolytes WNL - PT/OT/SLP as indicated, patient is at baseline at present  Anxiety - Not on home anxiolytic medications  Tobacco abuse - Encourage cessation; patient reports readiness to quit  Labs:  CBC: Recent Labs  Lab 07/03/24 1446 07/03/24 1953  WBC 12.7* 15.4*  NEUTROABS 10.6*  --   HGB 13.8 13.0  HCT 39.8 37.3  MCV 92.1 92.3  PLT 302 299   Basic  Metabolic Panel: Recent Labs  Lab 07/03/24 1446  NA 137  K 3.7  CL 104  CO2 21*  GLUCOSE 102*  BUN 15  CREATININE 0.80  CALCIUM 9.6   GFR: CrCl cannot be calculated (Unknown ideal weight.). Recent Labs  Lab 07/03/24 1446 07/03/24 1953  WBC 12.7* 15.4*   Liver Function Tests: Recent Labs  Lab  07/03/24 1446  AST 23  ALT 8  ALKPHOS 64  BILITOT 0.3  PROT 7.1  ALBUMIN 4.4   No results for input(s): LIPASE, AMYLASE in the last 168 hours. No results for input(s): AMMONIA in the last 168 hours.  ABG: No results found for: PHART, PCO2ART, PO2ART, HCO3, TCO2, ACIDBASEDEF, O2SAT   Coagulation Profile: Recent Labs  Lab 07/03/24 1953  INR 1.0    Cardiac Enzymes: No results for input(s): CKTOTAL, CKMB, CKMBINDEX, TROPONINI in the last 168 hours.  HbA1C: No results found for: HGBA1C  CBG: No results for input(s): GLUCAP in the last 168 hours.  Review of Systems:   Review of systems completed with pertinent positives/negatives outlined in above HPI.  Past Medical History:  She,  has a past medical history of Anxiety and Lymphocytic colitis.   Surgical History:   Past Surgical History:  Procedure Laterality Date   ANKLE FRACTURE SURGERY     Social History:   reports that she has never smoked. She has never used smokeless tobacco. She reports that she does not drink alcohol and does not use drugs.   Family History:  Her family history includes Arthritis in her mother; Cervical cancer in her maternal aunt; Colon polyps in her father; Diabetes in her father and mother; Hypertension in her father and mother; Lung cancer in her paternal grandmother; Prostate cancer in her paternal uncle. There is no history of Colon cancer, Esophageal cancer, Stomach cancer, or Rectal cancer.   Allergies: Allergies[1]   Home Medications: Prior to Admission medications  Medication Sig Start Date End Date Taking? Authorizing Provider  colestipol  (COLESTID ) 1 g tablet Take 2 tablets (2 g total) by mouth 2 (two) times daily. 06/16/21   Abran Norleen SAILOR, MD   Signature:   Warren Shade, DNP, AGACNP-BC [facility]  Please see [email or web address] for pager details.  From 7A-7P if no response, please call [phone]. After hours, please call  ELink [phone].      [1]  Allergies Allergen Reactions   Sulfonamide Derivatives Anaphylaxis   "

## 2024-07-04 NOTE — Evaluation (Signed)
 Physical Therapy Evaluation Patient Details Name: Lori Bullock MRN: [id number] DOB: 1976-01-24 Today's Date: 07/04/2024  History of Present Illness  49 yo F adm 1/6 from [facility] with severe HA. Imaging revealed prepontine SAH. PMH: anxiety, lymphocytic colitis, tobacco abuse  Clinical Impression  Pt admitted with above. PTA pt indep, working, and driving. Pt c/o headache pain and stiff neck. Pt functioning at supervision/contact guard. Pt ambulation tolerance limited by worsening of headache. Suspect once headache pain under better control pt will progress well and be able to return home with family once medical stable. I do not anticipate pt to have PT needs post d/c. Acute PT to cont to follow. Pt will need to navigate flight of stairs to access bed/bath.        If plan is discharge home, recommend the following: Assist for transportation;Assistance with cooking/housework   Can travel by private vehicle        Equipment Recommendations None recommended by PT  Recommendations for Other Services       Functional Status Assessment Patient has had a recent decline in their functional status and demonstrates the ability to make significant improvements in function in a reasonable and predictable amount of time.     Precautions / Restrictions Precautions Precautions: Fall Precaution/Restrictions Comments: severe HA Restrictions Weight Bearing Restrictions Per Provider Order: No      Mobility  Bed Mobility Overal bed mobility: Modified Independent             General bed mobility comments: HOB elevated, increased time, no use of bed rails    Transfers Overall transfer level: Needs assistance Equipment used: None Transfers: Sit to/from Stand Sit to Stand: Contact guard assist           General transfer comment: contact guard for line management and headache but no physical assist    Ambulation/Gait Ambulation/Gait assistance: Contact guard assist Gait Distance  (Feet): 200 Feet Assistive device: None Gait Pattern/deviations: Step-through pattern, Decreased stride length Gait velocity: dec compared to baseline but appropriate     General Gait Details: guarded, cautious, limited neck ROM due to stiffness, reports of worsening of headache stating I can feel my heart beating in my head  Stairs            Wheelchair Mobility     Tilt Bed    Modified Rankin (Stroke Patients Only) Modified Rankin (Stroke Patients Only) Pre-Morbid Rankin Score: No symptoms Modified Rankin: No significant disability     Balance Overall balance assessment: Mild deficits observed, not formally tested                                           Pertinent Vitals/Pain Pain Assessment Pain Assessment: 0-10 Pain Score: 6  Pain Location: neck up into head, increased to 7-8/10 with mobility Pain Descriptors / Indicators: Headache Pain Intervention(s): Patient requesting pain meds-RN notified    Home Living Family/patient expects to be discharged to:: Private residence Living Arrangements: Spouse/significant other;Children (spouse works from home, son in 8th grade) Available Help at Discharge: Family;Available 24 hours/day Type of Home: House Home Access: Stairs to enter Entrance Stairs-Rails: None Entrance Stairs-Number of Steps: 2 Alternate Level Stairs-Number of Steps: flight Home Layout: Two level Home Equipment: Shower seat;Grab bars - tub/shower      Prior Function Prior Level of Function : Independent/Modified Independent;Working/employed;Driving  Mobility Comments: works as a firefighter, 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     Extremity/Trunk Assessment   Upper Extremity Assessment Upper Extremity Assessment: Overall WFL for tasks assessed    Lower Extremity Assessment Lower Extremity Assessment: Overall WFL for tasks assessed    Cervical / Trunk Assessment Cervical / Trunk Assessment: Other  exceptions Cervical / Trunk Exceptions: stiff neck limiting AROM  Communication   Communication Communication: No apparent difficulties    Cognition Arousal: Alert Behavior During Therapy: WFL for tasks assessed/performed   PT - Cognitive impairments: No apparent impairments                         Following commands: Intact       Cueing Cueing Techniques: Verbal cues     General Comments General comments (skin integrity, edema, etc.): VSS, pt assisted to restroom, pt mod I for pericare and hand hygiene    Exercises     Assessment/Plan    PT Assessment Patient needs continued PT services  PT Problem List Decreased strength;Decreased activity tolerance;Decreased balance;Decreased mobility;Pain       PT Treatment Interventions DME instruction;Gait training;Stair training;Functional mobility training;Therapeutic activities;Therapeutic exercise;Balance training    PT Goals (Current goals can be found in the Care Plan section)  Acute Rehab PT Goals Patient Stated Goal: stop the headache PT Goal Formulation: With patient Time For Goal Achievement: 07/18/24 Potential to Achieve Goals: Good Additional Goals Additional Goal #1: Pt to score > 19 on DGI to indicate minimal falls risk.    Frequency Min 3X/week     Co-evaluation               AM-PAC PT 6 Clicks Mobility  Outcome Measure Help needed turning from your back to your side while in a flat bed without using bedrails?: None Help needed moving from lying on your back to sitting on the side of a flat bed without using bedrails?: None Help needed moving to and from a bed to a chair (including a wheelchair)?: None Help needed standing up from a chair using your arms (e.g., wheelchair or bedside chair)?: None Help needed to walk in hospital room?: A Little Help needed climbing 3-5 steps with a railing? : A Little 6 Click Score: 22    End of Session Equipment Utilized During Treatment: Gait  belt Activity Tolerance: Patient limited by pain Patient left: in bed;with call bell/phone within reach;with nursing/sitter in room Nurse Communication: Mobility status PT Visit Diagnosis: Unsteadiness on feet (R26.81)    Time: 9255-9196 PT Time Calculation (min) (ACUTE ONLY): 19 min   Charges:   PT Evaluation $PT Eval Low Complexity: 1 Low   PT General Charges $$ ACUTE PT VISIT: 1 Visit         Norene Ames, PT, DPT Acute Rehabilitation Services Secure chat preferred Office #: [phone]   Norene CHRISTELLA Ames 07/04/2024, 8:20 AM

## 2024-07-04 NOTE — Progress Notes (Signed)
 Transcranial Doppler  Date POD PCO2 HCT BP  MCA ACA PCA OPHT SIPH VERT Basilar  1/7 JH   37.3 119/77 Right  Left   42  53   -24  *   33  25   20  24    47  32   -40  -41   -53           Right  Left                                            Right  Left                                             Right  Left                                             Right  Left                                            Right  Left                                            Right  Left                                        MCA = Middle Cerebral Artery      OPHT = Opthalmic Artery     BASILAR = Basilar Artery   ACA = Anterior Cerebral Artery     SIPH = Carotid Siphon PCA = Posterior Cerebral Artery   VERT = Verterbral Artery                   Normal MCA = 62+\-12 ACA = 50+\-12 PCA = 42+\-23    [place] Ratio: Right 2.00 Left 1.66   Results can be found under chart review under CV PROC. 07/04/2024 12:10 PM Darvell Monteforte RVT, RDMS

## 2024-07-04 NOTE — Progress Notes (Signed)
 Patient ID: Lori Bullock, female   DOB: [birth year], 49 y.o.   MRN: [id number] BP 112/67   Pulse 70   Temp 98.7 F (37.1 C) (Oral)   Resp 13   Ht 5' 6 (1.676 m)   Wt 60.2 kg   SpO2 96%   BMI 21.42 kg/m  Alert and oriented x 4, speech is clear and fluent Moving all extremities well Perrl, full eom Tongue and uvula midline Doing well, still with headache.

## 2024-07-04 NOTE — Plan of Care (Addendum)
 9276:  Dr. Gillie rounded on pt.  See provider note.  9244:  PT rounded on pt.  Pt mobilized on unit.  See PT note.  9143: CCM team, Dr. Claudene, rounded on pt.  See provider note.  0945:  OT rounded on pt.  Pt ambulated on unit.  See OT note.   Problem: Education: Goal: Knowledge of General Education information will improve Description: Including pain rating scale, medication(s)/side effects and non-pharmacologic comfort measures Outcome: Progressing   Problem: Health Behavior/Discharge Planning: Goal: Ability to manage health-related needs will improve Outcome: Progressing   Problem: Clinical Measurements: Goal: Ability to maintain clinical measurements within normal limits will improve Outcome: Progressing Goal: Will remain free from infection Outcome: Progressing Goal: Diagnostic test results will improve Outcome: Progressing Goal: Respiratory complications will improve Outcome: Progressing Goal: Cardiovascular complication will be avoided Outcome: Progressing   Problem: Activity: Goal: Risk for activity intolerance will decrease Outcome: Progressing   Problem: Nutrition: Goal: Adequate nutrition will be maintained Outcome: Progressing   Problem: Coping: Goal: Level of anxiety will decrease Outcome: Progressing   Problem: Elimination: Goal: Will not experience complications related to bowel motility Outcome: Progressing Goal: Will not experience complications related to urinary retention Outcome: Progressing   Problem: Pain Managment: Goal: General experience of comfort will improve and/or be controlled Outcome: Progressing   Problem: Safety: Goal: Ability to remain free from injury will improve Outcome: Progressing   Problem: Skin Integrity: Goal: Risk for impaired skin integrity will decrease Outcome: Progressing   Problem: Education: Goal: Knowledge of disease or condition will improve Outcome: Progressing Goal: Knowledge of secondary prevention will  improve (MUST DOCUMENT ALL) Outcome: Progressing Goal: Knowledge of patient specific risk factors will improve (DELETE if not current risk factor) Outcome: Progressing   Problem: Spontaneous Subarachnoid Hemorrhage Tissue Perfusion: Goal: Complications of Spontaneous Subarachnoid Hemorrhage will be minimized Outcome: Progressing   Problem: Coping: Goal: Will verbalize positive feelings about self Outcome: Progressing Goal: Will identify appropriate support needs Outcome: Progressing   Problem: Health Behavior/Discharge Planning: Goal: Ability to manage health-related needs will improve Outcome: Progressing Goal: Goals will be collaboratively established with patient/family Outcome: Progressing   Problem: Self-Care: Goal: Ability to participate in self-care as condition permits will improve Outcome: Progressing Goal: Verbalization of feelings and concerns over difficulty with self-care will improve Outcome: Progressing Goal: Ability to communicate needs accurately will improve Outcome: Progressing   Problem: Nutrition: Goal: Risk of aspiration will decrease Outcome: Progressing Goal: Dietary intake will improve Outcome: Progressing

## 2024-07-05 ENCOUNTER — Inpatient Hospital Stay (HOSPITAL_COMMUNITY)

## 2024-07-05 DIAGNOSIS — I608 Other nontraumatic subarachnoid hemorrhage: Principal | ICD-10-CM

## 2024-07-05 DIAGNOSIS — Z720 Tobacco use: Secondary | ICD-10-CM

## 2024-07-05 DIAGNOSIS — F419 Anxiety disorder, unspecified: Secondary | ICD-10-CM

## 2024-07-05 LAB — COMPREHENSIVE METABOLIC PANEL WITH GFR
ALT: 7 U/L (ref 0–44)
AST: 17 U/L (ref 15–41)
Albumin: 3.9 g/dL (ref 3.5–5.0)
Alkaline Phosphatase: 52 U/L (ref 38–126)
Anion gap: 8 (ref 5–15)
BUN: 6 mg/dL (ref 6–20)
CO2: 22 mmol/L (ref 22–32)
Calcium: 8.7 mg/dL — ABNORMAL LOW (ref 8.9–10.3)
Chloride: 105 mmol/L (ref 98–111)
Creatinine, Ser: 0.67 mg/dL (ref 0.44–1.00)
GFR, Estimated: >60 mL/min
Glucose, Bld: 95 mg/dL (ref 70–99)
Potassium: 3.7 mmol/L (ref 3.5–5.1)
Sodium: 136 mmol/L (ref 135–145)
Total Bilirubin: 0.3 mg/dL (ref 0.0–1.2)
Total Protein: 6.1 g/dL — ABNORMAL LOW (ref 6.5–8.1)

## 2024-07-05 LAB — CBC
HCT: 33.9 % — ABNORMAL LOW (ref 36.0–46.0)
Hemoglobin: 11.8 g/dL — ABNORMAL LOW (ref 12.0–15.0)
MCH: 32.4 pg (ref 26.0–34.0)
MCHC: 34.8 g/dL (ref 30.0–36.0)
MCV: 93.1 fL (ref 80.0–100.0)
Platelets: 254 K/uL (ref 150–400)
RBC: 3.64 MIL/uL — ABNORMAL LOW (ref 3.87–5.11)
RDW: 14.4 % (ref 11.5–15.5)
WBC: 8.4 K/uL (ref 4.0–10.5)
nRBC: 0 % (ref 0.0–0.2)

## 2024-07-05 LAB — RESP PANEL BY RT-PCR (RSV, FLU A&B, COVID)  RVPGX2
Influenza A by PCR: NEGATIVE
Influenza B by PCR: NEGATIVE
Resp Syncytial Virus by PCR: NEGATIVE
SARS Coronavirus 2 by RT PCR: NEGATIVE

## 2024-07-05 LAB — MAGNESIUM: Magnesium: 1.8 mg/dL (ref 1.7–2.4)

## 2024-07-05 LAB — PHOSPHORUS: Phosphorus: 2.2 mg/dL — ABNORMAL LOW (ref 2.5–4.6)

## 2024-07-05 MED ORDER — SODIUM CHLORIDE 0.9 % IV SOLN
12.5000 mg | Freq: Four times a day (QID) | INTRAVENOUS | Status: AC | PRN
  Administered 2024-07-05 – 2024-07-07 (×3): 12.5 mg via INTRAVENOUS
  Filled 2024-07-05 (×4): qty 0.5
  Filled 2024-07-05: qty 12.5

## 2024-07-05 MED ORDER — SODIUM CHLORIDE 0.9 % IV SOLN: 12.5000 mg | Freq: Four times a day (QID) | INTRAVENOUS | Status: DC | PRN

## 2024-07-05 MED ORDER — MORPHINE SULFATE (PF) 2 MG/ML IV SOLN
1.0000 mg | INTRAVENOUS | Status: DC | PRN
  Administered 2024-07-05: 1 mg via INTRAVENOUS
  Administered 2024-07-05: 2 mg via INTRAVENOUS
  Administered 2024-07-06 (×2): 1 mg via INTRAVENOUS
  Administered 2024-07-07: 2 mg via INTRAVENOUS
  Filled 2024-07-05 (×5): qty 1

## 2024-07-05 MED ORDER — GUAIFENESIN-DM 100-10 MG/5ML PO SYRP
5.0000 mL | ORAL_SOLUTION | ORAL | Status: DC | PRN
  Administered 2024-07-05 – 2024-07-11 (×13): 5 mL via ORAL
  Filled 2024-07-05 (×16): qty 5

## 2024-07-05 NOTE — Plan of Care (Signed)
" °  Problem: Education: Goal: Knowledge of General Education information will improve Description: Including pain rating scale, medication(s)/side effects and non-pharmacologic comfort measures Outcome: Progressing   Problem: Health Behavior/Discharge Planning: Goal: Ability to manage health-related needs will improve Outcome: Progressing   Problem: Clinical Measurements: Goal: Ability to maintain clinical measurements within normal limits will improve Outcome: Progressing Goal: Will remain free from infection Outcome: Progressing Goal: Diagnostic test results will improve Outcome: Progressing Goal: Respiratory complications will improve Outcome: Progressing Goal: Cardiovascular complication will be avoided Outcome: Progressing   Problem: Activity: Goal: Risk for activity intolerance will decrease Outcome: Progressing   Problem: Nutrition: Goal: Adequate nutrition will be maintained Outcome: Progressing   Problem: Elimination: Goal: Will not experience complications related to bowel motility Outcome: Progressing Goal: Will not experience complications related to urinary retention Outcome: Progressing   Problem: Pain Managment: Goal: General experience of comfort will improve and/or be controlled Outcome: Progressing   Problem: Safety: Goal: Ability to remain free from injury will improve Outcome: Progressing   Problem: Skin Integrity: Goal: Risk for impaired skin integrity will decrease Outcome: Progressing   Problem: Coping: Goal: Will verbalize positive feelings about self Outcome: Progressing Goal: Will identify appropriate support needs Outcome: Progressing   Problem: Self-Care: Goal: Ability to participate in self-care as condition permits will improve Outcome: Progressing Goal: Verbalization of feelings and concerns over difficulty with self-care will improve Outcome: Progressing Goal: Ability to communicate needs accurately will improve Outcome:  Progressing   Problem: Nutrition: Goal: Risk of aspiration will decrease Outcome: Progressing Goal: Dietary intake will improve Outcome: Progressing   "

## 2024-07-05 NOTE — Evaluation (Signed)
 Speech Language Pathology Evaluation Patient Details Name: Lori Bullock MRN: [id number] DOB: 11-18-1975 Today's Date: 07/05/2024 Time: 1600-1610 SLP Time Calculation (min) (ACUTE ONLY): 10 min  Problem List:  Patient Active Problem List   Diagnosis Date Noted   Subarachnoid hemorrhage (HCC) 07/03/2024   Left wrist pain 02/02/2011   Hand joint pain 02/02/2011   Headache 02/10/2010   Benign neoplasm of skin 11/20/2009   Past Medical History:  Past Medical History:  Diagnosis Date   Anxiety    Lymphocytic colitis    Jakie   Past Surgical History:  Past Surgical History:  Procedure Laterality Date   ANKLE FRACTURE SURGERY     HPI:  49 yo female presenting from [facility] with sudden onset of headache, neck pain, and dizziness. Imaging revealed prepontine SAH. PMH includes anxiety, lymphocytic colitis, tobacco use   Assessment / Plan / Recommendation Clinical Impression  Pt is independent at baseline, working full time as a firefighter and living at home with her spouse and 33 yo son. She scored WFL on all administered subsections of the Cognistat, denying acute cognitive-linguistic changes. Ongoing SLP f/u is not warranted acutely, will sign off.    SLP Assessment  SLP Recommendation/Assessment: Patient does not need any further Speech Language Pathology Services SLP Visit Diagnosis: Cognitive communication deficit (R41.841)     Assistance Recommended at Discharge  PRN  Functional Status Assessment Patient has not had a recent decline in their functional status  Frequency and Duration           SLP Evaluation Cognition  Overall Cognitive Status: Within Functional Limits for tasks assessed       Comprehension  Auditory Comprehension Overall Auditory Comprehension: Appears within functional limits for tasks assessed    Expression Expression Primary Mode of Expression: Verbal Verbal Expression Overall Verbal Expression: Appears within functional limits  for tasks assessed   Oral / Motor  Oral Motor/Sensory Function Overall Oral Motor/Sensory Function: Within functional limits Motor Speech Overall Motor Speech: Appears within functional limits for tasks assessed            Damien Blumenthal, M.A., CCC-SLP Speech Language Pathology, Acute Rehabilitation Services  Secure Chat preferred [phone]  07/05/2024, 5:02 PM

## 2024-07-05 NOTE — Progress Notes (Signed)
 eLink Physician-Brief Progress Note Patient Name: Lori Bullock DOB: [birth year] MRN: [id number]   Date of Service  07/05/2024  HPI/Events of Note  Patient with sudden increase in intensity of headache and associated neck pain above prior baseline, she's also been nauseated.  eICU Interventions  Stat CT brain ordered to follow up SAH.        Kenyatta Gloeckner U Barak Bialecki 07/05/2024, 2:35 AM

## 2024-07-05 NOTE — Plan of Care (Addendum)
 0800:  CCM NP Paul Hauffman rounded on pt.  See provider note.  0915:  Pt c/o nausea and vomiting after receiving IV Zofran  4 mg at 0805.  CCM NP Hoffman notified.  See new orders.     Problem: Education: Goal: Knowledge of General Education information will improve Description: Including pain rating scale, medication(s)/side effects and non-pharmacologic comfort measures Outcome: Progressing   Problem: Health Behavior/Discharge Planning: Goal: Ability to manage health-related needs will improve Outcome: Progressing   Problem: Clinical Measurements: Goal: Ability to maintain clinical measurements within normal limits will improve Outcome: Progressing Goal: Will remain free from infection Outcome: Progressing Goal: Diagnostic test results will improve Outcome: Progressing Goal: Respiratory complications will improve Outcome: Progressing Goal: Cardiovascular complication will be avoided Outcome: Progressing   Problem: Activity: Goal: Risk for activity intolerance will decrease Outcome: Progressing   Problem: Nutrition: Goal: Adequate nutrition will be maintained Outcome: Progressing   Problem: Coping: Goal: Level of anxiety will decrease Outcome: Progressing   Problem: Elimination: Goal: Will not experience complications related to bowel motility Outcome: Progressing Goal: Will not experience complications related to urinary retention Outcome: Progressing   Problem: Pain Managment: Goal: General experience of comfort will improve and/or be controlled Outcome: Progressing   Problem: Safety: Goal: Ability to remain free from injury will improve Outcome: Progressing   Problem: Skin Integrity: Goal: Risk for impaired skin integrity will decrease Outcome: Progressing   Problem: Education: Goal: Knowledge of disease or condition will improve Outcome: Progressing Goal: Knowledge of secondary prevention will improve (MUST DOCUMENT ALL) Outcome: Progressing Goal:  Knowledge of patient specific risk factors will improve (DELETE if not current risk factor) Outcome: Progressing   Problem: Spontaneous Subarachnoid Hemorrhage Tissue Perfusion: Goal: Complications of Spontaneous Subarachnoid Hemorrhage will be minimized Outcome: Progressing   Problem: Coping: Goal: Will verbalize positive feelings about self Outcome: Progressing Goal: Will identify appropriate support needs Outcome: Progressing   Problem: Health Behavior/Discharge Planning: Goal: Ability to manage health-related needs will improve Outcome: Progressing Goal: Goals will be collaboratively established with patient/family Outcome: Progressing   Problem: Self-Care: Goal: Ability to participate in self-care as condition permits will improve Outcome: Progressing Goal: Verbalization of feelings and concerns over difficulty with self-care will improve Outcome: Progressing Goal: Ability to communicate needs accurately will improve Outcome: Progressing   Problem: Nutrition: Goal: Risk of aspiration will decrease Outcome: Progressing Goal: Dietary intake will improve Outcome: Progressing

## 2024-07-05 NOTE — Progress Notes (Addendum)
 "  NAME:  Lori Bullock, MRN:  [id number], DOB:  01/18/1976, LOS: 2 ADMISSION DATE:  07/03/2024 CONSULTATION DATE:  07/03/2024 REFERRING MD:  Gillie - NSGY CHIEF COMPLAINT:  SAH   History of Present Illness:  49 year old woman who presented to [facility] a [facility] with sudden onset of HA/neck pain, dizziness. PMHx significant for anxiety, lymphocytic colitis, tobacco abuse. Recently started on estradiol patches.  On presentation to [facility] ED, patient was afebrile with HR 69, BP 129/81, RR 18, SpO2 100%. Labs were notable for WBC 12.7, Hgb 13.8, Plt 302. Na 137, K 3.7, CO2 21, BUN/Cr 15/0.80. LFTs WNL. hCG negative. CTA Head/Neck demonstated perimesencephalic SAH, possibly venous in origin, negative for aneurysm or LVO. Patient's husband states that she recently started a new estradiol patch a few weeks ago and at that time had a headache; she removed the patch and did not replace it until this past Sunday, 1/4 with recurrence of headache on day of admission. Patient describes HA as sudden onset, severe, shooting pain as well as posterior neck pain. She has intermittent nausea and photosensitivity. Pain is improved somewhat with morphine , HA now a 6-7/10. NSGY (Dr. Gillie) admitting.  PCCM consulted for assistance with medical management.  Pertinent Medical History:   Past Medical History:  Diagnosis Date   Anxiety    Lymphocytic colitis    [facility] Events: Including procedures, antibiotic start and stop dates in addition to other pertinent events   1/6 - Presented to [facility] with sudden onset of severe HA + neck pain. CTA Head/Neck +SAH, negative for aneurysm/LVO. Transferred to [facility] for close monitoring and NSGY resources. PCCM consulted. 1/8 CT head - stable  Interim History / Subjective:  Still complains of headache, neck ache, nuchal rigidity. Some nausea overnight in addition to worsening pain prompted a CT head showing mildly decreased small volume SAH. No  interval abnormality.   Objective:  Blood pressure 123/82, pulse 61, temperature 98.8 F (37.1 C), temperature source Axillary, resp. rate 13, height 5' 6 (1.676 m), weight 60.2 kg, SpO2 95%.        Intake/Output Summary (Last 24 hours) at 07/05/2024 0825 Last data filed at 07/04/2024 2157 Gross per 24 hour  Intake 1672.49 ml  Output --  Net 1672.49 ml   Filed Weights   07/04/24 1631  Weight: 60.2 kg    Physical Examination: General: middle aged female in NAD HEENT: [place]/AT, PERRL, no JVD Pulm: Normal effort. Clear.  CV: RRR, no m/g/r GI: Soft, NT, ND Neuro: Alert, oriented x3. 5/5 strength in all extremities.   Resolved Hospital Problem List:    Assessment & Plan:   Subarachnoid hemorrhage, non-aneurysmal CTA Head/Neck 1/6 perimesencephalic SAH, possibly venous in origin, negative for aneurysm or LVO.. CT 1/8 stable - Dr. Gillie primary - Keep SBP < 150 mmHg - Multimodal pain control with Tylenol , morphine ; can consider Fioricet , ensure total APAP no greater than 4g/24H - TCDs per protocol - Nimodipine  - Keppra  for at least 7 days sz ppx - Frequent neuro checks - Neuroprotective measures: HOB > 30 degrees, normoglycemia, normothermia, electrolytes WNL - Repeat CBC/CMP today  Cough - husband is flu positive - Check flu swab - Robitussin PRN for cough suppression with morphine  PRN for breakthrough episodes. Goal here is to limit ICP spikes from coughing.   Anxiety - Not on home anxiolytic medications  Tobacco abuse - Encourage cessation; patient reports readiness to quit  Signature:   Critical care time 34 minutes  Deward Eastern, AGACNP-BC [facility]  See Amion for personal pager PCCM on call pager [phone] until 7pm. Please call Elink 7p-7a. [phone]  07/05/2024 8:33 AM       "

## 2024-07-06 ENCOUNTER — Inpatient Hospital Stay (HOSPITAL_COMMUNITY)

## 2024-07-06 DIAGNOSIS — I609 Nontraumatic subarachnoid hemorrhage, unspecified: Secondary | ICD-10-CM | POA: Diagnosis not present

## 2024-07-06 LAB — BASIC METABOLIC PANEL WITH GFR
Anion gap: 9 (ref 5–15)
BUN: 7 mg/dL (ref 6–20)
CO2: 26 mmol/L (ref 22–32)
Calcium: 9.3 mg/dL (ref 8.9–10.3)
Chloride: 103 mmol/L (ref 98–111)
Creatinine, Ser: 0.7 mg/dL (ref 0.44–1.00)
GFR, Estimated: >60 mL/min
Glucose, Bld: 83 mg/dL (ref 70–99)
Potassium: 3.9 mmol/L (ref 3.5–5.1)
Sodium: 138 mmol/L (ref 135–145)

## 2024-07-06 LAB — CBC
HCT: 36.8 % (ref 36.0–46.0)
Hemoglobin: 12.7 g/dL (ref 12.0–15.0)
MCH: 32.1 pg (ref 26.0–34.0)
MCHC: 34.5 g/dL (ref 30.0–36.0)
MCV: 92.9 fL (ref 80.0–100.0)
Platelets: 277 K/uL (ref 150–400)
RBC: 3.96 MIL/uL (ref 3.87–5.11)
RDW: 13.9 % (ref 11.5–15.5)
WBC: 6.2 K/uL (ref 4.0–10.5)
nRBC: 0 % (ref 0.0–0.2)

## 2024-07-06 MED ORDER — MAGNESIUM SULFATE 2 GM/50ML IV SOLN
2.0000 g | Freq: Once | INTRAVENOUS | Status: AC
  Administered 2024-07-06: 2 g via INTRAVENOUS
  Filled 2024-07-06: qty 50

## 2024-07-06 MED ORDER — K PHOS MONO-SOD PHOS DI & MONO 155-852-130 MG PO TABS
500.0000 mg | ORAL_TABLET | ORAL | Status: AC
  Administered 2024-07-06 (×3): 500 mg via ORAL
  Filled 2024-07-06 (×3): qty 2

## 2024-07-06 MED ORDER — POLYETHYLENE GLYCOL 3350 17 G PO PACK
17.0000 g | PACK | Freq: Every day | ORAL | Status: DC
  Administered 2024-07-06 – 2024-07-08 (×2): 17 g via ORAL
  Filled 2024-07-06 (×2): qty 1

## 2024-07-06 NOTE — Progress Notes (Signed)
" ° °  NAME:  Lori Bullock, MRN:  [id number], DOB:  12/11/1975, LOS: 3 ADMISSION DATE:  07/03/2024 CONSULTATION DATE:  07/03/2024 REFERRING MD:  Gillie - NSGY CHIEF COMPLAINT:  SAH   History of Present Illness:  49 year old woman who presented to [facility] as a transfer from [facility] with sudden onset of HA/neck pain, dizziness. PMHx significant for anxiety, lymphocytic colitis, tobacco abuse. Recently started on estradiol patches.  On presentation to [facility] ED, patient was afebrile with HR 69, BP 129/81, RR 18, SpO2 100%. Labs were notable for WBC 12.7, Hgb 13.8, Plt 302. Na 137, K 3.7, CO2 21, BUN/Cr 15/0.80. LFTs WNL. hCG negative. CTA Head/Neck demonstated perimesencephalic SAH, possibly venous in origin, negative for aneurysm or LVO. Patient's husband states that she recently started a new estradiol patch a few weeks ago and at that time had a headache; she removed the patch and did not replace it until this past Sunday, 1/4 with recurrence of headache on day of admission. Patient describes HA as sudden onset, severe, shooting pain as well as posterior neck pain. She has intermittent nausea and photosensitivity. Pain is improved somewhat with morphine , HA now a 6-7/10. NSGY (Dr. Gillie) admitting.  PCCM consulted for assistance with medical management.  Pertinent Medical History:   Past Medical History:  Diagnosis Date   Anxiety    Lymphocytic colitis    [facility] Events: Including procedures, antibiotic start and stop dates in addition to other pertinent events   1/6 - Presented to [facility] with sudden onset of severe HA + neck pain. CTA Head/Neck +SAH, negative for aneurysm/LVO. Transferred to [facility] for close monitoring and NSGY resources. PCCM consulted. 1/8 CT head - stable  Interim History / Subjective:  Feeling a lot better today.  Pain well controlled Nausea improved  Objective:  Blood pressure 117/78, pulse 89, temperature 98.6 F (37 C), temperature source Oral,  resp. rate 13, height 5' 6 (1.676 m), weight 60.2 kg, SpO2 98%.        Intake/Output Summary (Last 24 hours) at 07/06/2024 9191 Last data filed at 07/06/2024 0400 Gross per 24 hour  Intake 940 ml  Output --  Net 940 ml   Filed Weights   07/04/24 1631  Weight: 60.2 kg    Physical Examination:  General: Middle aged female in NAD HEENT: [place]/AT, PERRL Pulm: Clear normal effort.  CV: RRR, no MRG GI: Soft, NT Neuro: Alert oriented, non-focal  Resolved Hospital Problem List:    Assessment & Plan:   Subarachnoid hemorrhage, non-aneurysmal CTA Head/Neck 1/6 perimesencephalic SAH, possibly venous in origin, negative for aneurysm or LVO.. CT 1/8 stable - Dr. Gillie primary - Keep SBP < 150 mmHg - Multimodal pain control with Tylenol  #3, morphine  - TCD per protocol - Nimodipine  - Keppra  for at least 7 days sz ppx - Frequent neuro checks - Neuroprotective measures: HOB > 30 degrees, normoglycemia, normothermia, electrolytes WNL - Repeat CTA in the near future per neurosurgery  Cough - Flu swab negative - Robitussin PRN for cough suppression with morphine  PRN for breakthrough episodes.   Anxiety - Not on home anxiolytic medications  Tobacco abuse - Encourage cessation; patient reports readiness to quit  Signature:    Deward Eastern, AGACNP-BC [facility]  See Amion for personal pager PCCM on call pager [phone] until 7pm. Please call Elink 7p-7a. [phone]  07/06/2024 8:08 AM       "

## 2024-07-06 NOTE — Progress Notes (Signed)
 Physical Therapy Treatment/ discharge Patient Details Name: Lori Bullock MRN: [id number] DOB: [birth year] Today's Date: 07/06/2024   History of Present Illness 49 yo F adm 07/03/24 with severe HA, prepontine SAH. 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 Pt normally active, independent, and working. Pt initially reports posterior head pressure as 6/10 with decrease to 2/10 with increased ambulation. Pt able to perform head turns with gait although limited by neck pain and walking functional speed. Pt performed stairs and encouraged mobility acutely with nursing approval. No further acute P.T. needs with pt aware and agreeable.   Initial 130/72 After gait 145/85 HR 87-101   If plan is discharge home, recommend the following: Assist for transportation;Assistance with cooking/housework   Can travel by private vehicle        Equipment Recommendations  None recommended by PT    Recommendations for Other Services       Precautions / Restrictions Precautions Precautions: Other (comment) Recall of Precautions/Restrictions: Intact Precaution/Restrictions Comments: SBP <150     Mobility  Bed Mobility Overal bed mobility: Modified Independent                  Transfers Overall transfer level: Independent                      Ambulation/Gait Ambulation/Gait assistance: Independent Gait Distance (Feet): 1200 Feet Assistive device: None Gait Pattern/deviations: WFL(Within Functional Limits)   Gait velocity interpretation: >4.37 ft/sec, indicative of normal walking speed       Stairs Stairs: Yes Stairs assistance: Modified independent (Device/Increase time) Stair Management: Alternating pattern, Forwards, One rail Left Number of Stairs: 11     Wheelchair Mobility     Tilt Bed    Modified Rankin  (Stroke Patients Only) Modified Rankin (Stroke Patients Only) Pre-Morbid Rankin Score: No symptoms Modified Rankin: No significant disability     Balance Overall balance assessment: No apparent balance deficits (not formally assessed)                                          Communication Communication Communication: No apparent difficulties  Cognition Arousal: Alert Behavior During Therapy: WFL for tasks assessed/performed   PT - Cognitive impairments: No apparent impairments                         Following commands: Intact      Cueing Cueing Techniques: Verbal cues  Exercises      General Comments        Pertinent Vitals/Pain Pain Assessment Pain Score: 2  Pain Location: head pressure Pain Descriptors / Indicators: Headache Pain Intervention(s): Monitored during session, Repositioned    Home Living                          Prior Function            PT Goals (current goals can now be found in the care plan section) Progress towards PT goals: Goals met/education completed, patient discharged from PT    Frequency           PT Plan      Co-evaluation  AM-PAC PT 6 Clicks Mobility   Outcome Measure  Help needed turning from your back to your side while in a flat bed without using bedrails?: None Help needed moving from lying on your back to sitting on the side of a flat bed without using bedrails?: None Help needed moving to and from a bed to a chair (including a wheelchair)?: None Help needed standing up from a chair using your arms (e.g., wheelchair or bedside chair)?: None Help needed to walk in hospital room?: None Help needed climbing 3-5 steps with a railing? : None 6 Click Score: 24    End of Session   Activity Tolerance: Patient tolerated treatment well Patient left: in chair;with call bell/phone within reach;with nursing/sitter in room Nurse Communication: Mobility status PT  Visit Diagnosis: Other abnormalities of gait and mobility (R26.89)     Time: 8766-8745 PT Time Calculation (min) (ACUTE ONLY): 21 min  Charges:    $Gait Training: 8-22 mins PT General Charges $$ ACUTE PT VISIT: 1 Visit                     Lenoard SQUIBB, PT Acute Rehabilitation Services Office: [phone]    Lenoard NOVAK Jancarlo Biermann 07/06/2024, 1:02 PM

## 2024-07-06 NOTE — Progress Notes (Signed)
 Pharmacy Electrolyte Replacement  Recent Labs:  Recent Labs    07/05/24 0937 07/06/24 0451  K 3.7 3.9  MG 1.8  --   PHOS 2.2*  --   CREATININE 0.67 0.70    Low Critical Values (K </= 2.5, Phos </= 1, Mg </= 1) Present: None  MD Contacted: n/a  Plan: K Phos  neutral 500 mg PO  x 3 doses, 2 g mag sulfate IV x1 per protocol   Rankin Sams, PharmD, BCCCP Clinical Pharmacist

## 2024-07-06 NOTE — Progress Notes (Signed)
 Transcranial Doppler  Date POD PCO2 HCT BP  MCA ACA PCA OPHT SIPH VERT Basilar  1/7 JH   37.3 119/77 Right  Left   42  53   -24  *   33  25   20  24    47  32   -40  -41   -53      1/9 GC     Right  Left   46  *   *  -11   43  29   24  32   27  35   -20  -27   -53           Right  Left                                             Right  Left                                             Right  Left                                            Right  Left                                            Right  Left                                        MCA = Middle Cerebral Artery      OPHT = Opthalmic Artery     BASILAR = Basilar Artery   ACA = Anterior Cerebral Artery     SIPH = Carotid Siphon PCA = Posterior Cerebral Artery   VERT = Verterbral Artery                   Normal MCA = 62+\-12 ACA = 50+\-12 PCA = 42+\-23    [place] Ratio: Right 1.77    Results can be found under chart review under CV PROC.  07/06/2024 11:02 AM Cathlyn Collet RVT

## 2024-07-06 NOTE — Progress Notes (Signed)
 Occupational Therapy Treatment & discharge Patient Details Name: Lori Bullock MRN: [id number] DOB: August 22, 1975 Today's Date: 07/06/2024   History of present illness 49 yo F adm 07/03/24 with severe HA, prepontine SAH. PMH: anxiety, lymphocytic colitis, tobacco abuse   OT comments  Pt has meet her OT goals. Overall she is indep with all ADLs and functional mobility. She had complaints of a mild headache that did not change with position changes. Reviewed BE FAST education, pt with good awareness. OT to sign off acutely, no follow up needed.        If plan is discharge home, recommend the following:  Assist for transportation;Assistance with cooking/housework   Equipment Recommendations  None recommended by OT       Precautions / Restrictions Precautions Precautions: Other (comment) Recall of Precautions/Restrictions: Intact Precaution/Restrictions Comments: SBP <150 Restrictions Weight Bearing Restrictions Per Provider Order: No       Mobility Bed Mobility Overal bed mobility: Independent                  Transfers Overall transfer level: Independent                       Balance Overall balance assessment: No apparent balance deficits (not formally assessed)           ADL either performed or assessed with clinical judgement   ADL Overall ADL's : Independent           General ADL Comments: pt took a full shower unassisted    Extremity/Trunk Assessment Upper Extremity Assessment Upper Extremity Assessment: Overall WFL for tasks assessed   Lower Extremity Assessment Lower Extremity Assessment: Overall WFL for tasks assessed        Vision   Vision Assessment?: No apparent visual deficits   Perception Perception Perception: Within Functional Limits   Praxis Praxis Praxis: WFL   Communication Communication Communication: No apparent difficulties   Cognition Arousal: Alert Behavior During Therapy: WFL for tasks  assessed/performed Cognition: No apparent impairments                 Following commands: Intact        Cueing   Cueing Techniques: Verbal cues  Exercises      Shoulder Instructions       General Comments VSS - reviewed BE FAST    Pertinent Vitals/ Pain       Pain Assessment Pain Assessment: Faces Faces Pain Scale: Hurts a little bit Pain Location: head pressure Pain Descriptors / Indicators: Headache Pain Intervention(s): Limited activity within patient's tolerance, Monitored during session   Progress Toward Goals  OT Goals(current goals can now be found in the care plan section)  Progress towards OT goals: Goals met/education completed, patient discharged from OT  Acute Rehab OT Goals Patient Stated Goal: to go home OT Goal Formulation: With patient Time For Goal Achievement: 07/06/24 Potential to Achieve Goals: Good  Plan      AM-PAC OT 6 Clicks Daily Activity     Outcome Measure   Help from another person eating meals?: None Help from another person taking care of personal grooming?: None Help from another person toileting, which includes using toliet, bedpan, or urinal?: None Help from another person bathing (including washing, rinsing, drying)?: None Help from another person to put on and taking off regular upper body clothing?: None Help from another person to put on and taking off regular lower body clothing?: None 6 Click Score: 24    End of  Session    OT Visit Diagnosis: Muscle weakness (generalized) (M62.81)   Activity Tolerance Patient tolerated treatment well   Patient Left in bed;with call bell/phone within reach   Nurse Communication Mobility status        Time: 1446-1500 OT Time Calculation (min): 14 min  Charges: OT General Charges $OT Visit: 1 Visit OT Treatments $Self Care/Home Management : 8-22 mins  Lucie Kendall, OTR/L Acute Rehabilitation Services Office [phone] Secure Chat Communication  Preferred   Lucie JONETTA Kendall 07/06/2024, 3:58 PM

## 2024-07-07 ENCOUNTER — Inpatient Hospital Stay (HOSPITAL_COMMUNITY)

## 2024-07-07 DIAGNOSIS — R051 Acute cough: Secondary | ICD-10-CM

## 2024-07-07 DIAGNOSIS — I609 Nontraumatic subarachnoid hemorrhage, unspecified: Secondary | ICD-10-CM | POA: Diagnosis not present

## 2024-07-07 DIAGNOSIS — E871 Hypo-osmolality and hyponatremia: Secondary | ICD-10-CM | POA: Diagnosis not present

## 2024-07-07 LAB — SODIUM
Sodium: 131 mmol/L — ABNORMAL LOW (ref 135–145)
Sodium: 135 mmol/L (ref 135–145)

## 2024-07-07 LAB — BASIC METABOLIC PANEL WITH GFR
Anion gap: 9 (ref 5–15)
BUN: 9 mg/dL (ref 6–20)
CO2: 26 mmol/L (ref 22–32)
Calcium: 8.7 mg/dL — ABNORMAL LOW (ref 8.9–10.3)
Chloride: 100 mmol/L (ref 98–111)
Creatinine, Ser: 0.7 mg/dL (ref 0.44–1.00)
GFR, Estimated: >60 mL/min
Glucose, Bld: 107 mg/dL — ABNORMAL HIGH (ref 70–99)
Potassium: 4 mmol/L (ref 3.5–5.1)
Sodium: 134 mmol/L — ABNORMAL LOW (ref 135–145)

## 2024-07-07 LAB — CBC
HCT: 35.6 % — ABNORMAL LOW (ref 36.0–46.0)
Hemoglobin: 12.4 g/dL (ref 12.0–15.0)
MCH: 31.9 pg (ref 26.0–34.0)
MCHC: 34.8 g/dL (ref 30.0–36.0)
MCV: 91.5 fL (ref 80.0–100.0)
Platelets: 259 K/uL (ref 150–400)
RBC: 3.89 MIL/uL (ref 3.87–5.11)
RDW: 13.8 % (ref 11.5–15.5)
WBC: 8.7 K/uL (ref 4.0–10.5)
nRBC: 0 % (ref 0.0–0.2)

## 2024-07-07 MED ORDER — HYDROMORPHONE HCL 1 MG/ML IJ SOLN
1.0000 mg | Freq: Once | INTRAMUSCULAR | Status: AC
  Administered 2024-07-07: 1 mg via INTRAVENOUS
  Filled 2024-07-07: qty 1

## 2024-07-07 MED ORDER — BUTALBITAL-APAP-CAFFEINE 50-325-40 MG PO TABS
2.0000 | ORAL_TABLET | Freq: Four times a day (QID) | ORAL | Status: AC
  Administered 2024-07-07 – 2024-07-08 (×3): 2 via ORAL
  Filled 2024-07-07 (×3): qty 2

## 2024-07-07 MED ORDER — KETOROLAC TROMETHAMINE 30 MG/ML IJ SOLN
30.0000 mg | Freq: Three times a day (TID) | INTRAMUSCULAR | Status: DC | PRN
  Administered 2024-07-07 – 2024-07-11 (×13): 30 mg via INTRAVENOUS
  Filled 2024-07-07 (×15): qty 1

## 2024-07-07 MED ORDER — SODIUM CHLORIDE 0.9 % IV BOLUS
1000.0000 mL | Freq: Once | INTRAVENOUS | Status: AC
  Administered 2024-07-07: 1000 mL via INTRAVENOUS

## 2024-07-07 MED ORDER — BUTALBITAL-APAP-CAFFEINE 50-325-40 MG PO TABS
1.0000 | ORAL_TABLET | Freq: Four times a day (QID) | ORAL | Status: DC
  Administered 2024-07-07: 1 via ORAL
  Filled 2024-07-07: qty 1

## 2024-07-07 MED ORDER — HYDROMORPHONE HCL 1 MG/ML IJ SOLN
0.5000 mg | INTRAMUSCULAR | Status: DC | PRN
  Administered 2024-07-08: 0.5 mg via INTRAVENOUS
  Filled 2024-07-07: qty 1

## 2024-07-07 MED ORDER — SODIUM CHLORIDE 3 % IV SOLN
INTRAVENOUS | Status: DC
  Filled 2024-07-07 (×3): qty 500

## 2024-07-07 MED ORDER — SODIUM CHLORIDE 0.9 % IV SOLN
12.5000 mg | Freq: Four times a day (QID) | INTRAVENOUS | Status: DC | PRN
  Administered 2024-07-07 – 2024-07-09 (×2): 12.5 mg via INTRAVENOUS
  Filled 2024-07-07: qty 12.5

## 2024-07-07 NOTE — Plan of Care (Signed)
" °  Problem: Education: Goal: Knowledge of General Education information will improve Description: Including pain rating scale, medication(s)/side effects and non-pharmacologic comfort measures Outcome: Progressing   Problem: Health Behavior/Discharge Planning: Goal: Ability to manage health-related needs will improve Outcome: Progressing   Problem: Clinical Measurements: Goal: Ability to maintain clinical measurements within normal limits will improve Outcome: Progressing Goal: Will remain free from infection Outcome: Progressing Goal: Diagnostic test results will improve Outcome: Progressing Goal: Respiratory complications will improve Outcome: Progressing Goal: Cardiovascular complication will be avoided Outcome: Progressing   Problem: Activity: Goal: Risk for activity intolerance will decrease Outcome: Progressing   Problem: Coping: Goal: Level of anxiety will decrease Outcome: Progressing   Problem: Elimination: Goal: Will not experience complications related to bowel motility Outcome: Progressing Goal: Will not experience complications related to urinary retention Outcome: Progressing   Problem: Safety: Goal: Ability to remain free from injury will improve Outcome: Progressing   Problem: Skin Integrity: Goal: Risk for impaired skin integrity will decrease Outcome: Progressing   Problem: Education: Goal: Knowledge of disease or condition will improve Outcome: Progressing Goal: Knowledge of secondary prevention will improve (MUST DOCUMENT ALL) Outcome: Progressing Goal: Knowledge of patient specific risk factors will improve (DELETE if not current risk factor) Outcome: Progressing   Problem: Spontaneous Subarachnoid Hemorrhage Tissue Perfusion: Goal: Complications of Spontaneous Subarachnoid Hemorrhage will be minimized Outcome: Progressing   Problem: Coping: Goal: Will verbalize positive feelings about self Outcome: Progressing Goal: Will identify  appropriate support needs Outcome: Progressing   Problem: Health Behavior/Discharge Planning: Goal: Ability to manage health-related needs will improve Outcome: Progressing Goal: Goals will be collaboratively established with patient/family Outcome: Progressing   Problem: Self-Care: Goal: Ability to participate in self-care as condition permits will improve Outcome: Progressing Goal: Verbalization of feelings and concerns over difficulty with self-care will improve Outcome: Progressing Goal: Ability to communicate needs accurately will improve Outcome: Progressing   Problem: Nutrition: Goal: Risk of aspiration will decrease Outcome: Progressing Goal: Dietary intake will improve Outcome: Progressing   Problem: Nutrition: Goal: Adequate nutrition will be maintained Outcome: Not Progressing   Problem: Pain Managment: Goal: General experience of comfort will improve and/or be controlled Outcome: Not Progressing   "

## 2024-07-07 NOTE — Progress Notes (Addendum)
 "  NAME:  Lori Bullock, MRN:  [id number], DOB:  [birth year], LOS: 4 ADMISSION DATE:  07/03/2024 CONSULTATION DATE:  07/03/2024 REFERRING MD:  Gillie - NSGY CHIEF COMPLAINT:  SAH   History of Present Illness:  49 year old woman who presented to [facility] as a transfer from [facility] 1/6 with sudden onset of HA/neck pain, dizziness. PMHx significant for anxiety, lymphocytic colitis, tobacco abuse. Recently started on estradiol patches.  On presentation to [facility] ED, patient was afebrile with HR 69, BP 129/81, RR 18, SpO2 100%. Labs were notable for WBC 12.7, Hgb 13.8, Plt 302. Na 137, K 3.7, CO2 21, BUN/Cr 15/0.80. LFTs WNL. hCG negative. CTA Head/Neck demonstated perimesencephalic SAH, possibly venous in origin, negative for aneurysm or LVO. Patient's husband states that she recently started a new estradiol patch a few weeks ago and at that time had a headache; she removed the patch and did not replace it until this past Sunday, 1/4 with recurrence of headache on day of admission. Patient describes HA as sudden onset, severe, shooting pain as well as posterior neck pain. She has intermittent nausea and photosensitivity. Pain is improved somewhat with morphine , HA now a 6-7/10. NSGY (Dr. Gillie) admitting.  PCCM consulted for assistance with medical management.  Pertinent Medical History:   Past Medical History:  Diagnosis Date   Anxiety    Lymphocytic colitis    [facility] Events: Including procedures, antibiotic start and stop dates in addition to other pertinent events   1/6 - Presented to [facility] with sudden onset of severe HA + neck pain. CTA Head/Neck +SAH, negative for aneurysm/LVO. Transferred to [facility] for close monitoring and NSGY resources. PCCM consulted. 1/8 CT head - stable  Interim History / Subjective:  Bad HA overnight. CT stable to improved. Reports drinking a lot of coffee.   Objective:  Blood pressure (!) 105/52, pulse 66, temperature 98.6 F (37 C), temperature  source Axillary, resp. rate 12, height 5' 6 (1.676 m), weight 60.2 kg, SpO2 97%.        Intake/Output Summary (Last 24 hours) at 07/07/2024 1121 Last data filed at 07/07/2024 9161 Gross per 24 hour  Intake 1600.4 ml  Output --  Net 1600.4 ml   Filed Weights   07/04/24 1631  Weight: 60.2 kg    Physical Examination:  General: Middle aged female lying in bed lights off with codl rag on forehead HEENT: AT [place] Pulm: NWOB on RA CV: RRR GI: ND Neuro: Alert oriented, non-focal  Resolved Hospital Problem List:    Assessment & Plan:   Subarachnoid hemorrhage, non-aneurysmal CTA Head/Neck 1/6 perimesencephalic SAH, possibly venous in origin, negative for aneurysm or LVO.. CT 1/10 stable with improvement in blood - Dr. Gillie primary - Keep SBP < 150 mmHg - TCD per protocol - Nimodipine  - Keppra  for at least 7 days sz ppx - Frequent neuro checks - Neuroprotective measures: HOB > 30 degrees, normoglycemia, normothermia, electrolytes WNL - Repeat CTA in the near future per neurosurgery  Cough - Flu swab negative - Robitussin PRN for cough suppression with morphine  PRN for breakthrough episodes.   Headache Likely related to [facility] now improving, suspect caffeine  withdrawal as reports drinking on average a pot of coffee a day --schedule Fioricet , d/c other acetaminophen  containing products --Dilaudid  PRN with improvement  Hyponatremia: New, mild na 134, goal Na normal --1L LR bolus to correct any hypovoemia, repeat Na in PM and start low dose 3% Saline if needed   Signature:    Donnice SAUNDERS  Gena Laski, MD  See Amion for personal pager PCCM on call pager [phone] until 7pm. Please call Elink 7p-7a. [phone]  07/07/2024 11:21 AM       "

## 2024-07-07 NOTE — Progress Notes (Signed)
" °   07/07/24 0042  Pain Assessment  Pain Scale 0-10  Pain Score 10  Pain Type Acute pain  Pain Location Head  Pain Radiating Towards Down spine  Pain Intervention(s) Medication (See eMAR)   New 10/10 head pain radiating down back of the neck and spine. Pain is refractory to 1 mg morphine  IV and Q4 Tylenol  #3. Jennetta, NP with nsgy contacted and asked for 1 mg Dilaudid  IVP x1 and stat head to r/u changes. Pain meds given with improvement in pain and head CT done.  "

## 2024-07-07 NOTE — Progress Notes (Signed)
 Patient with increased headache overnight.  No nausea or vomiting.  No new neurologic symptoms otherwise.  She is afebrile.  Her vital signs are stable.  She is awake and alert.  She is oriented and appropriate.  She is obviously uncomfortable.  Speech is fluent.  Judgment and insight appear intact.  Cranial nerve function normal bilaterally.  Motor examination 5/5 bilaterally.  No pronator drift.  Follow-up head CT scan demonstrates near complete resolution of her perimesencephalic subarachnoid hemorrhage.  Small amount of hyperdensity in her left cerebral peduncle of unclear etiology.  Patient with CT angio negative subarachnoid hemorrhage.  MRI scan negative for aneurysm or neoplastic disease.  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  Plan follow-up angio this week sometime.  Continue supportive efforts.

## 2024-07-08 DIAGNOSIS — R051 Acute cough: Secondary | ICD-10-CM | POA: Diagnosis not present

## 2024-07-08 DIAGNOSIS — E871 Hypo-osmolality and hyponatremia: Secondary | ICD-10-CM | POA: Diagnosis not present

## 2024-07-08 DIAGNOSIS — I609 Nontraumatic subarachnoid hemorrhage, unspecified: Secondary | ICD-10-CM | POA: Diagnosis not present

## 2024-07-08 LAB — SODIUM
Sodium: 135 mmol/L (ref 135–145)
Sodium: 137 mmol/L (ref 135–145)
Sodium: 134 mmol/L — ABNORMAL LOW (ref 135–145)

## 2024-07-08 MED ORDER — BUTALBITAL-APAP-CAFFEINE 50-325-40 MG PO TABS
2.0000 | ORAL_TABLET | ORAL | Status: DC | PRN
  Administered 2024-07-08 – 2024-07-11 (×17): 2 via ORAL
  Filled 2024-07-08 (×18): qty 2

## 2024-07-08 MED ORDER — BUTALBITAL-APAP-CAFFEINE 50-325-40 MG PO TABS
2.0000 | ORAL_TABLET | Freq: Four times a day (QID) | ORAL | Status: DC | PRN
  Administered 2024-07-08: 2 via ORAL
  Filled 2024-07-08: qty 2

## 2024-07-08 MED ORDER — OXYCODONE HCL 5 MG PO TABS
2.5000 mg | ORAL_TABLET | ORAL | Status: DC | PRN
  Administered 2024-07-09 – 2024-07-11 (×3): 2.5 mg via ORAL
  Filled 2024-07-08 (×3): qty 1

## 2024-07-08 NOTE — Progress Notes (Signed)
 Patient looks much better today.  Her neck and back pain are significantly improved.  Her headache is also improved.  No new neurologic complaints.  She is afebrile.  Her vital signs are stable.  She is awake and alert.  She is oriented and appropriate.  Motor and sensory function intact.  Cranial nerve function normal bilaterally.  Status post angio negative subarachnoid hemorrhage.  Plan follow-up studies early this week.  If negative may work toward discharge home.

## 2024-07-08 NOTE — Progress Notes (Signed)
 "  NAME:  Lori Bullock, MRN:  [id number], DOB:  [birth year], LOS: 5 ADMISSION DATE:  07/03/2024 CONSULTATION DATE:  07/03/2024 REFERRING MD:  Gillie - NSGY CHIEF COMPLAINT:  SAH   History of Present Illness:  49 year old woman who presented to [facility] as a transfer from [facility] with sudden onset of HA/neck pain, dizziness. PMHx significant for anxiety, lymphocytic colitis, tobacco abuse. Recently started on estradiol patches.  On presentation to [facility] ED, patient was afebrile with HR 69, BP 129/81, RR 18, SpO2 100%. Labs were notable for WBC 12.7, Hgb 13.8, Plt 302. Na 137, K 3.7, CO2 21, BUN/Cr 15/0.80. LFTs WNL. hCG negative. CTA Head/Neck demonstated perimesencephalic SAH, possibly venous in origin, negative for aneurysm or LVO. Patient's husband states that she recently started a new estradiol patch a few weeks ago and at that time had a headache; she removed the patch and did not replace it until this past Sunday, 1/4 with recurrence of headache on day of admission. Patient describes HA as sudden onset, severe, shooting pain as well as posterior neck pain. She has intermittent nausea and photosensitivity. Pain is improved somewhat with morphine , HA now a 6-7/10. NSGY (Dr. Gillie) admitting.  PCCM consulted for assistance with medical management.  Pertinent Medical History:   Past Medical History:  Diagnosis Date   Anxiety    Lymphocytic colitis    [facility] Events: Including procedures, antibiotic start and stop dates in addition to other pertinent events   1/6 - Presented to [facility] with sudden onset of severe HA + neck pain. CTA Head/Neck +SAH, negative for aneurysm/LVO. Transferred to [facility] for close monitoring and NSGY resources. PCCM consulted. 1/8 CT head - stable  Interim History / Subjective:  Headache a bit improved.  Still bothersome.  But much less severe.  Objective:  Blood pressure 114/71, pulse 92, temperature 98.3 F (36.8 C), temperature source  Axillary, resp. rate (!) 24, height 5' 6 (1.676 m), weight 60.2 kg, SpO2 96%.        Intake/Output Summary (Last 24 hours) at 07/08/2024 1512 Last data filed at 07/08/2024 1200 Gross per 24 hour  Intake 765.67 ml  Output --  Net 765.67 ml   Filed Weights   07/04/24 1631  Weight: 60.2 kg    Physical Examination:  General: Lying in bed no distress HEENT: Atraumatic normocephalic Pulm: Normal work of breathing CV: Regular rate and rhythm GI: ND Neuro: Alert and oriented, no focal deficits  Resolved Hospital Problem List:    Assessment & Plan:   Subarachnoid hemorrhage, non-aneurysmal CTA Head/Neck 1/6 perimesencephalic SAH, possibly venous in origin, negative for aneurysm or LVO.. CT 1/10 stable with improvement in blood - Dr. Gillie primary - Keep SBP < 150 mmHg - TCD per protocol - Nimodipine  to continue at neurosurgery discretion - Keppra  for at least 7 days sz ppx - Frequent neuro checks - Neuroprotective measures: HOB > 30 degrees, normoglycemia, normothermia, electrolytes WNL - Repeat CTA in the near future per neurosurgery to ensure no aneurysm  Cough - Flu swab negative - Robitussin PRN for cough suppression with morphine  PRN for breakthrough episodes.   Headache Likely related to [facility] now improving, suspect caffeine  withdrawal as reports drinking on average a pot of coffee a day -- Fioricet  as needed, will need to decrease frequency over time  -- Oxycodone  as needed for breakthrough  Hyponatremia: New, mild, goal Na normal -- continue 3% saline at this time   Signature:    Donnice JONELLE Beals,  MD  See Tracey for personal pager PCCM on call pager [phone] until 7pm. Please call Elink 7p-7a. [phone]  07/08/2024 3:12 PM       "

## 2024-07-09 ENCOUNTER — Inpatient Hospital Stay (HOSPITAL_COMMUNITY)

## 2024-07-09 DIAGNOSIS — I609 Nontraumatic subarachnoid hemorrhage, unspecified: Secondary | ICD-10-CM

## 2024-07-09 DIAGNOSIS — E871 Hypo-osmolality and hyponatremia: Secondary | ICD-10-CM | POA: Diagnosis not present

## 2024-07-09 DIAGNOSIS — R051 Acute cough: Secondary | ICD-10-CM | POA: Diagnosis not present

## 2024-07-09 LAB — BASIC METABOLIC PANEL WITH GFR
Anion gap: 12 (ref 5–15)
BUN: 6 mg/dL (ref 6–20)
CO2: 20 mmol/L — ABNORMAL LOW (ref 22–32)
Calcium: 8.5 mg/dL — ABNORMAL LOW (ref 8.9–10.3)
Chloride: 106 mmol/L (ref 98–111)
Creatinine, Ser: 0.8 mg/dL (ref 0.44–1.00)
GFR, Estimated: >60 mL/min
Glucose, Bld: 82 mg/dL (ref 70–99)
Potassium: 3.9 mmol/L (ref 3.5–5.1)
Sodium: 137 mmol/L (ref 135–145)

## 2024-07-09 LAB — RESPIRATORY PANEL BY PCR
Adenovirus: NOT DETECTED
Bordetella Parapertussis: NOT DETECTED
Bordetella pertussis: NOT DETECTED
Chlamydophila pneumoniae: NOT DETECTED
Coronavirus 229E: NOT DETECTED
Coronavirus HKU1: NOT DETECTED
Coronavirus NL63: NOT DETECTED
Coronavirus OC43: NOT DETECTED
Influenza A H1 2009: DETECTED — AB
Influenza B: NOT DETECTED
Metapneumovirus: NOT DETECTED
Mycoplasma pneumoniae: NOT DETECTED
Parainfluenza Virus 1: NOT DETECTED
Parainfluenza Virus 2: NOT DETECTED
Parainfluenza Virus 3: NOT DETECTED
Parainfluenza Virus 4: NOT DETECTED
Respiratory Syncytial Virus: NOT DETECTED
Rhinovirus / Enterovirus: DETECTED — AB

## 2024-07-09 LAB — SODIUM: Sodium: 137 mmol/L (ref 135–145)

## 2024-07-09 NOTE — Progress Notes (Signed)
 "  NAME:  Lori Bullock, MRN:  [id number], DOB:  [birth year], LOS: 6 ADMISSION DATE:  07/03/2024 CONSULTATION DATE:  07/03/2024 REFERRING MD:  Gillie - NSGY CHIEF COMPLAINT:  SAH   History of Present Illness:  49 year old woman who presented to [facility] as a transfer from [facility] with sudden onset of HA/neck pain, dizziness. PMHx significant for anxiety, lymphocytic colitis, tobacco abuse. Recently started on estradiol patches.  On presentation to [facility] ED, patient was afebrile with HR 69, BP 129/81, RR 18, SpO2 100%. Labs were notable for WBC 12.7, Hgb 13.8, Plt 302. Na 137, K 3.7, CO2 21, BUN/Cr 15/0.80. LFTs WNL. hCG negative. CTA Head/Neck demonstated perimesencephalic SAH, possibly venous in origin, negative for aneurysm or LVO. Patient's husband states that she recently started a new estradiol patch a few weeks ago and at that time had a headache; she removed the patch and did not replace it until this past Sunday, 1/4 with recurrence of headache on day of admission. Patient describes HA as sudden onset, severe, shooting pain as well as posterior neck pain. She has intermittent nausea and photosensitivity. Pain is improved somewhat with morphine , HA now a 6-7/10. NSGY (Dr. Gillie) admitting.  PCCM consulted for assistance with medical management.  Pertinent Medical History:   Past Medical History:  Diagnosis Date   Anxiety    Lymphocytic colitis    [facility] Events: Including procedures, antibiotic start and stop dates in addition to other pertinent events   1/6 - Presented to [facility] with sudden onset of severe HA + neck pain. CTA Head/Neck +SAH, negative for aneurysm/LVO. Transferred to [facility] for close monitoring and NSGY resources. PCCM consulted. 1/8 CT head - stable  Interim History / Subjective:  Headache continues to improve.  Some back pain likely more MSK in nature lying in bed.  Eager for repeat imaging eager to go home soon.  Objective:  Blood pressure  102/64, pulse 72, temperature 98.1 F (36.7 C), temperature source Oral, resp. rate 16, height 5' 6 (1.676 m), weight 60.2 kg, SpO2 95%.        Intake/Output Summary (Last 24 hours) at 07/09/2024 0959 Last data filed at 07/09/2024 0800 Gross per 24 hour  Intake 558.86 ml  Output --  Net 558.86 ml   Filed Weights   07/04/24 1631  Weight: 60.2 kg    Physical Examination:  General: Sitting up, no distress, lights are on HEENT: AT [place] Pulm: On room air, normal work of breathing CV: RRR GI: ND Neuro: No deficits, alert and oriented  Resolved Hospital Problem List:    Assessment & Plan:   Subarachnoid hemorrhage, non-aneurysmal CTA Head/Neck 1/6 perimesencephalic SAH, possibly venous in origin, negative for aneurysm or LVO.. CT 1/10 stable with improvement in blood - Dr. Gillie primary - Keep SBP < 150 mmHg - TCD per protocol - Nimodipine  to continue at neurosurgery discretion - Keppra  for at least 7 days sz ppx - Frequent neuro checks - Neuroprotective measures: HOB > 30 degrees, normoglycemia, normothermia, electrolytes WNL - Repeat CTA versus angiogram in the near future per neurosurgery to ensure no aneurysm  Cough - Flu swab negative - Robitussin PRN for cough suppression with morphine  PRN for breakthrough episodes.   Headache Likely related to [facility] now improving, suspect caffeine  withdrawal as reports drinking on average a pot of coffee a day -- Fioricet  as needed, using less frequently -- Oxycodone  as needed for breakthrough, she is not using  Hyponatremia: New, mild, goal Na normal --  continue 3% saline at this time   Signature:    Donnice JONELLE Beals, MD  See Tracey for personal pager PCCM on call pager [phone] until 7pm. Please call Elink 7p-7a. [phone]  07/09/2024 9:59 AM       "

## 2024-07-09 NOTE — Progress Notes (Addendum)
 Patient ID: Lori Bullock, female   DOB: 13-Nov-1975, 49 y.o.   MRN: [id number] BP 121/80   Pulse 90   Temp 98.6 F (37 C) (Oral)   Resp 20   Ht 5' 6 (1.676 m)   Wt 60.2 kg   SpO2 97%   BMI 21.42 kg/m  Alert and oriented x4. Perrl Moving all extremities well Symmetric facies, tongue and uvula midline There is no role for hypertonic sodium will be discontinued immediately.  Will schedule angio for this week.  Does not need Keppra , will discontinue now.

## 2024-07-09 NOTE — Progress Notes (Signed)
 Transcranial Doppler   Date POD PCO2 HCT BP   MCA ACA PCA OPHT SIPH VERT Basilar  1/7 JH     37.3 119/77 Right  Left   42  53   -24  *   33  25   20  24    47  32   -40  -41   -53       1/9 GC         Right  Left   46  *   *  -11   43  29   24  32   27  35   -20  -27   -53       1/12 JH      35.6  113/68  Right  Left    71   *    -21   *    *   30    21   19     54   33    -33   -28    -51                   Right  Left                                                                 Right  Left                                                               Right  Left                                                               Right  Left                                                       MCA = Middle Cerebral Artery      OPHT = Opthalmic Artery     BASILAR = Basilar Artery   ACA = Anterior Cerebral Artery     SIPH = Carotid Siphon PCA = Posterior Cerebral Artery   VERT = Verterbral Artery                    Normal MCA = 62+\-12 ACA = 50+\-12 PCA = 42+\-23      [place] Ratio: Right 2.15 Left n/a    Results can be found under chart review under CV PROC. 07/09/2024 5:17 PM Elija Mccamish RVT, RDMS

## 2024-07-10 LAB — BASIC METABOLIC PANEL WITH GFR
Anion gap: 9 (ref 5–15)
BUN: 6 mg/dL (ref 6–20)
CO2: 22 mmol/L (ref 22–32)
Calcium: 8.7 mg/dL — ABNORMAL LOW (ref 8.9–10.3)
Chloride: 104 mmol/L (ref 98–111)
Creatinine, Ser: 0.73 mg/dL (ref 0.44–1.00)
GFR, Estimated: >60 mL/min
Glucose, Bld: 93 mg/dL (ref 70–99)
Potassium: 3.2 mmol/L — ABNORMAL LOW (ref 3.5–5.1)
Sodium: 135 mmol/L (ref 135–145)

## 2024-07-10 MED ORDER — DIAZEPAM 5 MG PO TABS
5.0000 mg | ORAL_TABLET | Freq: Four times a day (QID) | ORAL | Status: DC | PRN
  Administered 2024-07-10 – 2024-07-11 (×5): 5 mg via ORAL
  Filled 2024-07-10 (×5): qty 1

## 2024-07-10 MED ORDER — POTASSIUM CHLORIDE CRYS ER 20 MEQ PO TBCR
40.0000 meq | EXTENDED_RELEASE_TABLET | ORAL | Status: AC
  Administered 2024-07-10 (×2): 40 meq via ORAL
  Filled 2024-07-10 (×2): qty 2

## 2024-07-10 MED ORDER — OSELTAMIVIR PHOSPHATE 75 MG PO CAPS
75.0000 mg | ORAL_CAPSULE | Freq: Two times a day (BID) | ORAL | Status: DC
  Administered 2024-07-10 – 2024-07-11 (×4): 75 mg via ORAL
  Filled 2024-07-10 (×4): qty 1

## 2024-07-10 NOTE — Progress Notes (Signed)
 Pharmacy Electrolyte Replacement  Recent Labs:  Recent Labs    07/10/24 0603  K 3.2*  CREATININE 0.73    Low Critical Values (K </= 2.5, Phos </= 1, Mg </= 1) Present: None  MD Contacted: n/a   Plan: KCL 40 mEq PO q4h x 2 doses per protocol   Rankin Sams, PharmD, BCCCP Clinical Pharmacist

## 2024-07-10 NOTE — Progress Notes (Signed)
 Patient ID: Lori Bullock, female   DOB: 21-Aug-1975, 49 y.o.   MRN: [id number] BP 126/89   Pulse 65   Temp 98.5 F (36.9 C) (Oral)   Resp 13   Ht 5' 6 (1.676 m)   Wt 60.2 kg   SpO2 97%   BMI 21.42 kg/m  Alert, oriented x 4 speech is clear and fluent Moving all extremities Complaining of severe pain in her lower back. Will try valium  to see if this augments the pain medication. However none of the pain medications seem to help.  Angio tomorrow

## 2024-07-11 ENCOUNTER — Other Ambulatory Visit (HOSPITAL_COMMUNITY): Payer: Self-pay

## 2024-07-11 ENCOUNTER — Inpatient Hospital Stay (HOSPITAL_COMMUNITY)

## 2024-07-11 DIAGNOSIS — R051 Acute cough: Secondary | ICD-10-CM | POA: Diagnosis not present

## 2024-07-11 DIAGNOSIS — I609 Nontraumatic subarachnoid hemorrhage, unspecified: Secondary | ICD-10-CM | POA: Diagnosis not present

## 2024-07-11 DIAGNOSIS — E871 Hypo-osmolality and hyponatremia: Secondary | ICD-10-CM | POA: Diagnosis not present

## 2024-07-11 HISTORY — PX: IR ANGIO INTRA EXTRACRAN SEL INTERNAL CAROTID BILAT MOD SED: IMG5363

## 2024-07-11 HISTORY — PX: IR ANGIO VERTEBRAL SEL VERTEBRAL BILAT MOD SED: IMG5369

## 2024-07-11 MED ORDER — HEPARIN SODIUM (PORCINE) 1000 UNIT/ML IJ SOLN
INTRAMUSCULAR | Status: AC | PRN
  Administered 2024-07-11: 2000 [IU] via INTRAVENOUS

## 2024-07-11 MED ORDER — IOHEXOL 300 MG/ML  SOLN
100.0000 mL | Freq: Once | INTRAMUSCULAR | Status: AC | PRN
  Administered 2024-07-11: 60 mL via INTRA_ARTERIAL

## 2024-07-11 MED ORDER — OXYCODONE HCL 5 MG PO TABS
5.0000 mg | ORAL_TABLET | Freq: Four times a day (QID) | ORAL | 0 refills | Status: AC | PRN
  Filled 2024-07-11: qty 28, 7d supply, fill #0

## 2024-07-11 MED ORDER — MIDAZOLAM HCL 2 MG/2ML IJ SOLN
INTRAMUSCULAR | Status: AC
  Filled 2024-07-11: qty 2

## 2024-07-11 MED ORDER — OSELTAMIVIR PHOSPHATE 75 MG PO CAPS
75.0000 mg | ORAL_CAPSULE | Freq: Two times a day (BID) | ORAL | 0 refills | Status: AC
  Filled 2024-07-11: qty 8, 4d supply, fill #0

## 2024-07-11 MED ORDER — LIDOCAINE HCL 1 % IJ SOLN
INTRAMUSCULAR | Status: AC
  Filled 2024-07-11: qty 20

## 2024-07-11 MED ORDER — OXYCODONE HCL 5 MG PO TABS
5.0000 mg | ORAL_TABLET | ORAL | Status: DC | PRN
  Administered 2024-07-11 (×2): 5 mg via ORAL
  Filled 2024-07-11 (×2): qty 1

## 2024-07-11 MED ORDER — HEPARIN SODIUM (PORCINE) 1000 UNIT/ML IJ SOLN
INTRAMUSCULAR | Status: AC
  Filled 2024-07-11: qty 10

## 2024-07-11 MED ORDER — LIDOCAINE HCL 1 % IJ SOLN
20.0000 mL | Freq: Once | INTRAMUSCULAR | Status: AC
  Administered 2024-07-11: 10 mL via INTRADERMAL
  Filled 2024-07-11: qty 20

## 2024-07-11 MED ORDER — FENTANYL CITRATE (PF) 100 MCG/2ML IJ SOLN
INTRAMUSCULAR | Status: AC | PRN
  Administered 2024-07-11: 50 ug via INTRAVENOUS

## 2024-07-11 MED ORDER — FENTANYL CITRATE (PF) 100 MCG/2ML IJ SOLN
INTRAMUSCULAR | Status: AC
  Filled 2024-07-11: qty 2

## 2024-07-11 NOTE — Progress Notes (Signed)
 eLink Physician-Brief Progress Note Patient Name: Geonna Lockyer DOB: 12-Oct-1975 MRN: [id number]   Date of Service  07/11/2024  HPI/Events of Note  theres still an active order for nimotop  however she is post angio, being discharged home tonight and not being discharged w any meds to take at home. Should nurse still be giving the nimotop  or can the  the order be d/c'd  Second angio done today was normal.   eICU Interventions  Advised bed side RN to contact Neurosurgery team for clarification regarding nimotop  usage at home or not [facility] -headache)      Intervention Category Intermediate Interventions: Other:  Jodelle ONEIDA Hutching 07/11/2024, 9:04 PM

## 2024-07-11 NOTE — Progress Notes (Signed)
" ° °  NAME:  Lori Bullock, MRN:  [id number], DOB:  Jul 04, 1975, LOS: 8 ADMISSION DATE:  07/03/2024 CONSULTATION DATE:  07/03/2024 REFERRING MD:  Gillie - NSGY CHIEF COMPLAINT:  SAH   History of Present Illness:  49 year old woman who presented to [facility] as a transfer from [facility] 1/6 with sudden onset of HA/neck pain, dizziness. PMHx significant for anxiety, lymphocytic colitis, tobacco abuse. Recently started on estradiol patches.  On presentation to [facility] ED, patient was afebrile with HR 69, BP 129/81, RR 18, SpO2 100%. Labs were notable for WBC 12.7, Hgb 13.8, Plt 302. Na 137, K 3.7, CO2 21, BUN/Cr 15/0.80. LFTs WNL. hCG negative. CTA Head/Neck demonstated perimesencephalic SAH, possibly venous in origin, negative for aneurysm or LVO. Patient's husband states that she recently started a new estradiol patch a few weeks ago and at that time had a headache; she removed the patch and did not replace it until this past Sunday, 1/4 with recurrence of headache on day of admission. Patient describes HA as sudden onset, severe, shooting pain as well as posterior neck pain. She has intermittent nausea and photosensitivity. Pain is improved somewhat with morphine , HA now a 6-7/10. NSGY (Dr. Gillie) admitting.  PCCM consulted for assistance with medical management.  Pertinent Medical History:   Past Medical History:  Diagnosis Date   Anxiety    Lymphocytic colitis    [facility] Events: Including procedures, antibiotic start and stop dates in addition to other pertinent events   1/6 - Presented to [facility] with sudden onset of severe HA + neck pain. CTA Head/Neck +SAH, negative for aneurysm/LVO. Transferred to [facility] for close monitoring and NSGY resources. PCCM consulted. 1/8 CT head - stable  Interim History / Subjective:  Headache continues. Plan angio today per NSG.  Objective:  Blood pressure 113/73, pulse 85, temperature 99 F (37.2 C), temperature source Oral, resp. rate (!) 21,  height 5' 6 (1.676 m), weight 60.2 kg, SpO2 98%.        Intake/Output Summary (Last 24 hours) at 07/11/2024 0750 Last data filed at 07/10/2024 2200 Gross per 24 hour  Intake 50 ml  Output --  Net 50 ml   Filed Weights   07/04/24 1631  Weight: 60.2 kg    Physical Examination:  Deferred  Resolved Hospital Problem List:    Assessment & Plan:   Subarachnoid hemorrhage, non-aneurysmal CTA Head/Neck 1/6 perimesencephalic SAH, possibly venous in origin, negative for aneurysm or LVO. CT 1/10 stable with improvement in blood. - Dr. Gillie primary - Keep SBP < 160 mmHg - TCD per protocol - Nimodipine  to continue at neurosurgery discretion - Keppra  stopped by NSG - Frequent neuro checks - Neuroprotective measures: HOB > 30 degrees, normoglycemia, normothermia, electrolytes WNL - Repeat angiogram in the near future per neurosurgery to ensure no aneurysm  Cough/Flu - Flu and Rhino + - Tamiflu  x 5 days recommended  Headache Likely related to [facility] now improving, suspect caffeine  withdrawal at one point as reports drinking on average a pot of coffee a day -- Fioricet  as needed -- Oxycodone  as needed for breakthrough, she is not using but willing  Hyponatremia:  New, mild, goal Na normal -- NSG dc'd 3%, further hypoNa treatment per NSG   Signature:    Donnice JONELLE Beals, MD  See Tracey for personal pager PCCM on call pager [phone] until 7pm. Please call Elink 7p-7a. [phone]  07/11/2024 7:50 AM       "

## 2024-07-11 NOTE — Progress Notes (Signed)
" °  NEUROSURGERY PROGRESS NOTE   No issues overnight. Pt cont to report HA.  EXAM:  BP 113/73   Pulse 85   Temp 99 F (37.2 C) (Oral)   Resp (!) 21   Ht 5' 6 (1.676 m)   Wt 60.2 kg   SpO2 98%   BMI 21.42 kg/m   Drowsy but easily arouses. oriented  Speech fluent, appropriate  CN grossly intact  5/5 BUE/BLE   TCD: Date POD PCO2 HCT BP   MCA ACA PCA OPHT SIPH VERT Basilar  1/7 JH     37.3 119/77 Right  Left   42  53   -24  *   33  25   20  24    47  32   -40  -41   -53       1/9 GC         Right  Left   46  *   *  -11   43  29   24  32   27  35   -20  -27   -53       1/12 JH      35.6  113/68  Right  Left    71   *    -21   *    *   30    21   19     54   33    -33   -28    -51        IMPRESSION:  49 y.o. female SAH d# 9, initial CTA negative. Remains neurologically intact with normal TCD.  PLAN: - Cont current supportive care - Plan on confirmatory diagnostic angiogram this pm - Possible d/c home after angio if normal and patient is amenable.   Gerldine Maizes, MD [facility]   "

## 2024-07-11 NOTE — Plan of Care (Signed)
" °  Problem: Education: Goal: Knowledge of General Education information will improve Description: Including pain rating scale, medication(s)/side effects and non-pharmacologic comfort measures Outcome: Progressing   Problem: Clinical Measurements: Goal: Ability to maintain clinical measurements within normal limits will improve Outcome: Progressing Goal: Will remain free from infection Outcome: Progressing Goal: Diagnostic test results will improve Outcome: Progressing Goal: Cardiovascular complication will be avoided Outcome: Progressing   Problem: Activity: Goal: Risk for activity intolerance will decrease Outcome: Progressing   Problem: Coping: Goal: Level of anxiety will decrease Outcome: Progressing   Problem: Elimination: Goal: Will not experience complications related to bowel motility Outcome: Progressing Goal: Will not experience complications related to urinary retention Outcome: Progressing   "

## 2024-07-11 NOTE — Progress Notes (Signed)
 Transcranial Doppler   Date POD PCO2 HCT BP   MCA ACA PCA OPHT SIPH VERT Basilar  1/7 JH     37.3 119/77 Right  Left   42  53   -24  *   33  25   20  24    47  32   -40  -41   -53       1/9 GC         Right  Left   46  *   *  -11   43  29   24  32   27  35   -20  -27   -53       1/12 JH      35.6  113/68  Right  Left    71   *    -21   *    *   30    21   19     54   33    -33   -28    -51       1/14 JH        35.6  113/73  Right  Left    51   50    *   *    22   24    24   25     50   32    -51   -26    -47                   Right  Left                                                               Right  Left                                                               Right  Left                                                       MCA = Middle Cerebral Artery      OPHT = Opthalmic Artery     BASILAR = Basilar Artery   ACA = Anterior Cerebral Artery     SIPH = Carotid Siphon PCA = Posterior Cerebral Artery   VERT = Verterbral Artery                    Normal MCA = 62+\-12 ACA = 50+\-12 PCA = 42+\-23      [place] Ratio: Right 1.89 Left 2.08  Results can be found under chart review under CV PROC. 07/11/2024 11:50 AM Rajveer Handler RVT, RDMS

## 2024-07-12 ENCOUNTER — Other Ambulatory Visit: Payer: Self-pay

## 2024-07-12 ENCOUNTER — Inpatient Hospital Stay (HOSPITAL_COMMUNITY)
Admission: EM | Admit: 2024-07-12 | Discharge: 2024-07-13 | DRG: 066 | Disposition: A | Discharge status: Home / Self Care | Attending: Neurosurgery | Admitting: Neurosurgery

## 2024-07-12 ENCOUNTER — Encounter (HOSPITAL_COMMUNITY): Payer: Self-pay

## 2024-07-12 ENCOUNTER — Emergency Department (HOSPITAL_COMMUNITY)

## 2024-07-12 DIAGNOSIS — Z8049 Family history of malignant neoplasm of other genital organs: Secondary | ICD-10-CM

## 2024-07-12 DIAGNOSIS — Z79818 Long term (current) use of other agents affecting estrogen receptors and estrogen levels: Secondary | ICD-10-CM

## 2024-07-12 DIAGNOSIS — Z8249 Family history of ischemic heart disease and other diseases of the circulatory system: Secondary | ICD-10-CM

## 2024-07-12 DIAGNOSIS — Z83719 Family history of colon polyps, unspecified: Secondary | ICD-10-CM

## 2024-07-12 DIAGNOSIS — Z882 Allergy status to sulfonamides status: Secondary | ICD-10-CM

## 2024-07-12 DIAGNOSIS — Z79899 Other long term (current) drug therapy: Secondary | ICD-10-CM

## 2024-07-12 DIAGNOSIS — R112 Nausea with vomiting, unspecified: Secondary | ICD-10-CM

## 2024-07-12 DIAGNOSIS — Z801 Family history of malignant neoplasm of trachea, bronchus and lung: Secondary | ICD-10-CM

## 2024-07-12 DIAGNOSIS — Z833 Family history of diabetes mellitus: Secondary | ICD-10-CM

## 2024-07-12 DIAGNOSIS — Z8261 Family history of arthritis: Secondary | ICD-10-CM

## 2024-07-12 DIAGNOSIS — J101 Influenza due to other identified influenza virus with other respiratory manifestations: Secondary | ICD-10-CM | POA: Diagnosis present

## 2024-07-12 DIAGNOSIS — I609 Nontraumatic subarachnoid hemorrhage, unspecified: Principal | ICD-10-CM | POA: Diagnosis present

## 2024-07-12 DIAGNOSIS — R519 Headache, unspecified: Principal | ICD-10-CM | POA: Diagnosis present

## 2024-07-12 DIAGNOSIS — R29700 NIHSS score 0: Secondary | ICD-10-CM | POA: Diagnosis present

## 2024-07-12 LAB — I-STAT CHEM 8, ED
BUN: 10 mg/dL (ref 6–20)
Calcium, Ion: 1.14 mmol/L — ABNORMAL LOW (ref 1.15–1.40)
Chloride: 102 mmol/L (ref 98–111)
Creatinine, Ser: 0.8 mg/dL (ref 0.44–1.00)
Glucose, Bld: 124 mg/dL — ABNORMAL HIGH (ref 70–99)
HCT: 39 % (ref 36.0–46.0)
Hemoglobin: 13.3 g/dL (ref 12.0–15.0)
Potassium: 3.7 mmol/L (ref 3.5–5.1)
Sodium: 136 mmol/L (ref 135–145)
TCO2: 20 mmol/L — ABNORMAL LOW (ref 22–32)

## 2024-07-12 LAB — CBC WITH DIFFERENTIAL/PLATELET
Abs Immature Granulocytes: 0.04 K/uL (ref 0.00–0.07)
Basophils Absolute: 0 K/uL (ref 0.0–0.1)
Basophils Relative: 0 %
Eosinophils Absolute: 0.1 K/uL (ref 0.0–0.5)
Eosinophils Relative: 1 %
HCT: 38.3 % (ref 36.0–46.0)
Hemoglobin: 13.2 g/dL (ref 12.0–15.0)
Immature Granulocytes: 1 %
Lymphocytes Relative: 13 %
Lymphs Abs: 1.1 K/uL (ref 0.7–4.0)
MCH: 31.8 pg (ref 26.0–34.0)
MCHC: 34.5 g/dL (ref 30.0–36.0)
MCV: 92.3 fL (ref 80.0–100.0)
Monocytes Absolute: 0.6 K/uL (ref 0.1–1.0)
Monocytes Relative: 8 %
Neutro Abs: 6.5 K/uL (ref 1.7–7.7)
Neutrophils Relative %: 77 %
Platelets: 306 K/uL (ref 150–400)
RBC: 4.15 MIL/uL (ref 3.87–5.11)
RDW: 13.3 % (ref 11.5–15.5)
WBC: 8.4 K/uL (ref 4.0–10.5)
nRBC: 0 % (ref 0.0–0.2)

## 2024-07-12 LAB — HCG, SERUM, QUALITATIVE: Preg, Serum: NEGATIVE

## 2024-07-12 LAB — COMPREHENSIVE METABOLIC PANEL WITH GFR
ALT: 22 U/L (ref 0–44)
AST: 32 U/L (ref 15–41)
Albumin: 4 g/dL (ref 3.5–5.0)
Alkaline Phosphatase: 88 U/L (ref 38–126)
Anion gap: 14 (ref 5–15)
BUN: 10 mg/dL (ref 6–20)
CO2: 20 mmol/L — ABNORMAL LOW (ref 22–32)
Calcium: 9 mg/dL (ref 8.9–10.3)
Chloride: 101 mmol/L (ref 98–111)
Creatinine, Ser: 0.82 mg/dL (ref 0.44–1.00)
GFR, Estimated: >60 mL/min
Glucose, Bld: 125 mg/dL — ABNORMAL HIGH (ref 70–99)
Potassium: 3.8 mmol/L (ref 3.5–5.1)
Sodium: 135 mmol/L (ref 135–145)
Total Bilirubin: 0.3 mg/dL (ref 0.0–1.2)
Total Protein: 6.9 g/dL (ref 6.5–8.1)

## 2024-07-12 MED ORDER — ONDANSETRON HCL 4 MG/2ML IJ SOLN
4.0000 mg | Freq: Once | INTRAMUSCULAR | Status: AC
  Administered 2024-07-12: 4 mg via INTRAVENOUS
  Filled 2024-07-12: qty 2

## 2024-07-12 MED ORDER — IOHEXOL 350 MG/ML SOLN
75.0000 mL | Freq: Once | INTRAVENOUS | Status: AC | PRN
  Administered 2024-07-12: 75 mL via INTRAVENOUS

## 2024-07-12 MED ORDER — DEXAMETHASONE SODIUM PHOSPHATE 4 MG/ML IJ SOLN
4.0000 mg | Freq: Four times a day (QID) | INTRAMUSCULAR | Status: DC
  Administered 2024-07-12 – 2024-07-13 (×4): 4 mg via INTRAVENOUS
  Filled 2024-07-12 (×4): qty 1

## 2024-07-12 MED ORDER — DOCUSATE SODIUM 100 MG PO CAPS
100.0000 mg | ORAL_CAPSULE | Freq: Two times a day (BID) | ORAL | Status: DC
  Administered 2024-07-13: 100 mg via ORAL
  Filled 2024-07-12 (×2): qty 1

## 2024-07-12 MED ORDER — BENZONATATE 100 MG PO CAPS: 200.0000 mg | ORAL_CAPSULE | Freq: Three times a day (TID) | ORAL | Status: DC | PRN

## 2024-07-12 MED ORDER — MORPHINE SULFATE (PF) 4 MG/ML IV SOLN: 4.0000 mg | Freq: Once | INTRAVENOUS | Status: DC | PRN

## 2024-07-12 MED ORDER — ONDANSETRON HCL 4 MG/2ML IJ SOLN
4.0000 mg | Freq: Four times a day (QID) | INTRAMUSCULAR | Status: DC | PRN
  Administered 2024-07-13: 4 mg via INTRAVENOUS
  Filled 2024-07-12: qty 2

## 2024-07-12 MED ORDER — HYDROMORPHONE HCL 1 MG/ML IJ SOLN
0.5000 mg | Freq: Once | INTRAMUSCULAR | Status: AC
  Administered 2024-07-12: 0.5 mg via INTRAVENOUS
  Filled 2024-07-12: qty 1

## 2024-07-12 MED ORDER — OXYCODONE HCL 5 MG PO TABS
5.0000 mg | ORAL_TABLET | ORAL | Status: DC | PRN
  Administered 2024-07-13 (×2): 5 mg via ORAL
  Filled 2024-07-12 (×2): qty 1

## 2024-07-12 MED ORDER — PROMETHAZINE HCL 12.5 MG PO TABS
25.0000 mg | ORAL_TABLET | Freq: Four times a day (QID) | ORAL | Status: DC
  Administered 2024-07-13 (×4): 25 mg via ORAL
  Filled 2024-07-12: qty 2
  Filled 2024-07-12: qty 1
  Filled 2024-07-12 (×2): qty 2

## 2024-07-12 MED ORDER — ACETAMINOPHEN 325 MG PO TABS
650.0000 mg | ORAL_TABLET | Freq: Four times a day (QID) | ORAL | Status: DC
  Administered 2024-07-13 (×2): 650 mg via ORAL
  Filled 2024-07-12 (×2): qty 2

## 2024-07-12 MED ORDER — SODIUM CHLORIDE 0.9% FLUSH
3.0000 mL | Freq: Two times a day (BID) | INTRAVENOUS | Status: DC
  Administered 2024-07-13 (×2): 3 mL via INTRAVENOUS

## 2024-07-12 MED ORDER — BISACODYL 5 MG PO TBEC: 5.0000 mg | DELAYED_RELEASE_TABLET | Freq: Every day | ORAL | Status: DC | PRN

## 2024-07-12 MED ORDER — SODIUM CHLORIDE 0.9 % IV BOLUS
1000.0000 mL | Freq: Once | INTRAVENOUS | Status: AC
  Administered 2024-07-12: 1000 mL via INTRAVENOUS

## 2024-07-12 MED ORDER — METOCLOPRAMIDE HCL 5 MG/ML IJ SOLN
10.0000 mg | Freq: Once | INTRAMUSCULAR | Status: AC
  Administered 2024-07-12: 10 mg via INTRAVENOUS
  Filled 2024-07-12: qty 2

## 2024-07-12 MED ORDER — ONDANSETRON 4 MG PO TBDP: 4.0000 mg | ORAL_TABLET | Freq: Four times a day (QID) | ORAL | Status: DC | PRN

## 2024-07-12 MED ORDER — DIAZEPAM 5 MG PO TABS: 5.0000 mg | ORAL_TABLET | Freq: Four times a day (QID) | ORAL | Status: DC | PRN

## 2024-07-12 MED ORDER — GUAIFENESIN ER 600 MG PO TB12
600.0000 mg | ORAL_TABLET | Freq: Two times a day (BID) | ORAL | Status: DC
  Administered 2024-07-13 (×2): 600 mg via ORAL
  Filled 2024-07-12 (×2): qty 1

## 2024-07-12 MED ORDER — POLYETHYLENE GLYCOL 3350 17 G PO PACK
17.0000 g | PACK | Freq: Every day | ORAL | Status: DC
  Filled 2024-07-12: qty 1

## 2024-07-12 MED ORDER — DIPHENHYDRAMINE HCL 50 MG/ML IJ SOLN
12.5000 mg | Freq: Once | INTRAMUSCULAR | Status: AC
  Administered 2024-07-12: 12.5 mg via INTRAVENOUS
  Filled 2024-07-12: qty 1

## 2024-07-12 MED ORDER — OSELTAMIVIR PHOSPHATE 75 MG PO CAPS
75.0000 mg | ORAL_CAPSULE | Freq: Two times a day (BID) | ORAL | Status: DC
  Administered 2024-07-13 (×2): 75 mg via ORAL
  Filled 2024-07-12 (×3): qty 1

## 2024-07-12 MED ORDER — ONDANSETRON HCL 4 MG/2ML IJ SOLN: 4.0000 mg | Freq: Four times a day (QID) | INTRAMUSCULAR | Status: DC | PRN

## 2024-07-12 MED ORDER — KETOROLAC TROMETHAMINE 30 MG/ML IJ SOLN: 30.0000 mg | Freq: Three times a day (TID) | INTRAMUSCULAR | Status: DC | PRN

## 2024-07-12 MED ORDER — BUTALBITAL-APAP-CAFFEINE 50-325-40 MG PO TABS
2.0000 | ORAL_TABLET | ORAL | Status: DC | PRN
  Administered 2024-07-13 (×2): 2 via ORAL
  Filled 2024-07-12 (×2): qty 2

## 2024-07-12 NOTE — Op Note (Addendum)
 " ENDOVASCULAR NEUROSURGERY OPERATIVE NOTE   PROCEDURE: Diagnostic Cerebral Angiogram   SURGEON:   Dr. Gerldine Maizes, MD  HISTORY:   The patient is a 49 y.o. yo female Initially presenting to the hospital with onset of headache.  Her initial CT scan demonstrated primarily prepontine subarachnoid hemorrhage with CT angiogram negative for intracranial aneurysm or arteriovenous malformation.  She has been monitored in the intensive care unit without change in neurologic condition and presents today for confirmatory diagnostic cerebral angiogram.  APPROACH:   The technical aspects of the procedure as well as its potential risks and benefits were reviewed with the patient. These risks included but were not limited bleeding, infection, allergic reaction, damage to organs/vital structures, stroke, non-diagnostic procedure, and the catastrophic outcomes of heart attack, coma, and death. With an understanding of these risks, informed consent was obtained and witnessed.    The patient was placed in the supine position on the angiography table and the skin of right wrist and groin prepped in the usual sterile fashion. The procedure was performed under local anesthesia (1%-solution of bicarbonate-bufferred Lidoacaine) and conscious sedation with Versed  and fentanyl  monitored by the in-suite nurse and myself, including non-invasive blood pressure and continuous pulse oxymetry.    Accessed the right common femoral artery was obtained using standard anatomic landmarks and a micropuncture needle.  A short five French sheath was placed using standard Seldinger technique.  HEPARIN :  2000 Units total.    CONTRAST AGENT:  See IR records   FLUOROSCOPY TIME:  See IR records    CATHETER(S) AND WIRE(S):    5-French JB-1 glidecatheter   0.035 glidewire    VESSELS CATHETERIZED:   Right internal carotid   Left internal carotid   Right vertebral   Left vertebral   Right common femoral  VESSELS  STUDIED:   Right internal carotid, head Left internal carotid, head Left vertebral Right vertebral Right femoral  PROCEDURAL NARRATIVE:   A 5-Fr JB-1 terumo glide catheter was advanced over a 0.035 glidewire into the aortic arch. The above vessels were then sequentially catheterized and cervical/cerebral angiograms taken. After review of images, the catheter was removed without incident.    INTERPRETATION:   Right internal carotid, head:   Injection reveals the presence of a widely patent ICA, M1, and A1 segments and their branches. No aneurysms, arteriovenous malformations, or high flow fistulas are visualized.  There is no vasospasm of the right carotid circulation. The parenchymal and venous phases are unremarkable. The venous sinuses are widely patent.    Left internal carotid, head:   Injection reveals the presence of a widely patent ICA, A1, and M1 segments and their branches. No aneurysms, arteriovenous malformations, or high flow fistulas are visualized.  There is no vasospasm of the left carotid circulation. The parenchymal and venous phases are unremarkable. The venous sinuses are widely patent.    Left vertebral:   Injection reveals the presence of a widely patent vertebral artery which is noted to arise directly from the aortic arch. This leads to a widely patent basilar artery that terminates in bilateral P1. The basilar apex is normal. No aneurysms, arteriovenous malformations, or high flow fistulas are visualized. The parenchymal and venous phases are normal. The venous sinuses are patent.    Right vertebral:    Normal vessel. No PICA aneurysm. See basilar description above.    Right femoral:    Normal vessel. No significant atherosclerotic disease. Arterial sheath in adequate position.   DISPOSITION:  Upon completion of the study,  the sheath was removed and hemostasis obtained using a 5-Fr Exoseal closure device. Good proximal and distal extremity pulses were documented  upon achievement of hemostasis. The procedure was well tolerated and no early complications were observed.  The patient was transferred to the neuro ICU to be positioned flat in bed for 3 hours.    IMPRESSION:  1. Normal diagnostic cerebral angiogram, without identification of intracranial aneurysm, arteriovenous malformation, or high-flow fistula.  There is no vasospasm.    Gerldine Maizes, MD [facility]   "

## 2024-07-12 NOTE — ED Provider Notes (Signed)
 " L[facility] AT [facility] Provider Note   CSN: [id number] Arrival date & time: 07/12/24  1907     Patient presents with: No chief complaint on file.   Lori Bullock is a 49 y.o. female.   49 year old female history of SAH recently who presents emergency department headache and back pain.  Patient reports that she was recently admitted for subarachnoid hemorrhage.  Was monitored and discharged yesterday after an angiogram does not show any aneurysm or other acute abnormality.  Reports that she has been having a headache as well as pain that runs all the way down her spine and even into her lower back.  This been present since hospitalization.  Has been trying oxycodone , promethazine , and multiple other medications at home without relief.  Also is having lots of nausea and vomiting and was recently diagnosed with the flu.       Prior to Admission medications  Medication Sig Start Date End Date Taking? Authorizing Provider  acetaminophen  (TYLENOL ) 500 MG tablet Take 1,000 mg by mouth every 8 (eight) hours as needed for headache or moderate pain (pain score 4-6).   Yes [provider]  estradiol (CLIMARA - DOSED IN MG/24 HR) 0.0375 mg/24hr patch Place 0.0375 mg onto the skin once a week.   Yes [provider]  guaifenesin  (ROBITUSSIN) 100 MG/5ML syrup Take 400 mg by mouth 3 (three) times daily as needed for congestion or cough.   Yes [provider]  ibuprofen (ADVIL) 200 MG tablet Take 200-400 mg by mouth every 8 (eight) hours as needed for headache or moderate pain (pain score 4-6).   Yes [provider]  levonorgestrel (MIRENA) 20 MCG/DAY IUD 1 each by Intrauterine route once.   Yes [provider]  Multiple Vitamins-Minerals (WOMENS MULTI GUMMIES) CHEW Chew 2 tablets by mouth daily. OLLI   Yes [provider]  Ondansetron  (ZOFRAN  ODT PO) Take 1 tablet by mouth daily as needed (for nausea).   Yes  [provider]  oseltamivir  (TAMIFLU ) 75 MG capsule Take 1 capsule (75 mg total) by mouth 2 (two) times daily for 4 days. 07/11/24 07/15/24 Yes [name], MD  oxyCODONE  (OXY IR/ROXICODONE ) 5 MG immediate release tablet Take 1 tablet (5 mg total) by mouth every 6 (six) hours as needed for up to 7 days for breakthrough pain. 07/11/24 07/18/24 Yes [name], MD  PROMETHAZINE  HCL PO Take 1 tablet by mouth daily as needed (for nausea).   Yes [provider]    Allergies: Sulfonamide derivatives    Review of Systems  Updated Vital Signs BP 133/83   Pulse 66   Temp 99 F (37.2 C) (Oral)   Resp 10   Ht 5' 6 (1.676 m)   Wt 72.6 kg   SpO2 98%   BMI 25.82 kg/m   Physical Exam Vitals and nursing note reviewed.  Constitutional:      General: She is not in acute distress.    Appearance: She is well-developed.  HENT:     Head: Normocephalic and atraumatic.     Right Ear: External ear normal.     Left Ear: External ear normal.     Nose: Nose normal.  Eyes:     Extraocular Movements: Extraocular movements intact.     Conjunctiva/sclera: Conjunctivae normal.     Pupils: Pupils are equal, round, and reactive to light.  Neck:     Comments: Pain with ranging neck Cardiovascular:     Rate and Rhythm:  Normal rate and regular rhythm.     Heart sounds: No murmur heard. Pulmonary:     Effort: Pulmonary effort is normal. No respiratory distress.     Breath sounds: Normal breath sounds.  Abdominal:     General: Abdomen is flat. There is no distension.     Palpations: Abdomen is soft. There is no mass.     Tenderness: There is no abdominal tenderness. There is no guarding.  Musculoskeletal:     Right lower leg: No edema.     Left lower leg: No edema.  Skin:    General: Skin is warm and dry.  Neurological:     Mental Status: She is alert and oriented to person, place, and time. Mental status is at baseline.     Cranial Nerves: No cranial nerve deficit.      Sensory: No sensory deficit.     Motor: No weakness.  Psychiatric:        Mood and Affect: Mood normal.     (all labs ordered are listed, but only abnormal results are displayed) Labs Reviewed  COMPREHENSIVE METABOLIC PANEL WITH GFR - Abnormal; Notable for the following components:      Result Value   CO2 20 (*)    Glucose, Bld 125 (*)    All other components within normal limits  I-STAT CHEM 8, ED - Abnormal; Notable for the following components:   Glucose, Bld 124 (*)    Calcium, Ion 1.14 (*)    TCO2 20 (*)    All other components within normal limits  CBC WITH DIFFERENTIAL/PLATELET  HCG, SERUM, QUALITATIVE  BASIC METABOLIC PANEL WITH GFR  CBC  MAGNESIUM     EKG: EKG Interpretation Date/Time:  Thursday July 12 2024 19:18:26 EST Ventricular Rate:  62 PR Interval:  140 QRS Duration:  112 QT Interval:  453 QTC Calculation: 460 R Axis:   34  Text Interpretation: Sinus rhythm Incomplete right bundle branch block Confirmed by Yolande Charleston [id number]) on 07/12/2024 7:23:02 PM  Radiology: CT VENOGRAM HEAD Result Date: 07/12/2024 CLINICAL DATA:  Initial evaluation for acute headache, recent ICH. EXAM: CT VENOGRAM HEAD TECHNIQUE: Venographic phase images of the brain were obtained following the administration of intravenous contrast. Multiplanar reformats and maximum intensity projections were generated. Multiplanar CT image reconstructions and MIPs were obtained to evaluate the vascular anatomy. RADIATION DOSE REDUCTION: This exam was performed according to the departmental dose-optimization program which includes automated exposure control, adjustment of the mA and/or kV according to patient size and/or use of iterative reconstruction technique. CONTRAST:  75mL OMNIPAQUE  IOHEXOL  350 MG/ML SOLN COMPARISON:  Comparison made with head CT performed at the same time as well as previous exams. FINDINGS: Normal enhancement seen throughout the superior sagittal sinus to the torcula.  Transverse and sigmoid sinuses are patent as are the jugular bulbs and visualized proximal internal jugular veins. Small filling defect at the distal left transverse sinus noted, likely an arachnoid granulation (series 6, image 23). Straight sinus, vein of Galen and internal cerebral veins are patent. No evidence for dural venous sinus thrombosis. No dural venous sinus stenosis. No appreciable cortical vein abnormality. No abnormality about the cavernous sinus. IMPRESSION: Negative CT venogram. No evidence for dural venous sinus thrombosis. Electronically Signed   By: Morene Hoard M.D.   On: 07/12/2024 21:54   CT Head Wo Contrast Result Date: 07/12/2024 CLINICAL DATA:  Initial evaluation for acute headache, recent ICH. EXAM: CT HEAD WITHOUT CONTRAST TECHNIQUE: Contiguous axial images were obtained from the base  of the skull through the vertex without intravenous contrast. RADIATION DOSE REDUCTION: This exam was performed according to the departmental dose-optimization program which includes automated exposure control, adjustment of the mA and/or kV according to patient size and/or use of iterative reconstruction technique. COMPARISON:  Prior CT from 07/07/2024 as well as earlier studies. FINDINGS: Brain: Cerebral volume within normal limits. Patchy hypodensity involving the supratentorial cerebral white matter, most commonly related to chronic microvascular ischemic disease. Previously seen intracranial hemorrhage has resolved, with no acute hemorrhage now seen. No acute large vessel territory infarct. No mass lesion, midline shift or mass effect. No hydrocephalus or extra-axial fluid collection. 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 Vascular: No abnormal hyperdense vessel. Skull: Scalp soft tissues within normal limits.  Calvarium intact. Sinuses/Orbits: Globes orbital soft tissues within normal limits. Scattered mucosal thickening about the sphenoid ethmoidal and maxillary sinuses. No  mastoid effusion. Other: None. IMPRESSION: 1. No acute intracranial abnormality. Previously seen hemorrhage has resolved, with no intracranial hemorrhage now seen. 2. Patchy hypodensity involving the supratentorial cerebral white matter, nonspecific, most commonly related to chronic microvascular ischemic disease. Electronically Signed   By: Morene Hoard M.D.   On: 07/12/2024 21:50   VAS US  TRANSCRANIAL DOPPLER Result Date: 07/12/2024  Transcranial Doppler Patient Name:  TARIANA MOLDOVAN  Date of Exam:   07/11/2024 Medical Rec #: [id number]            Accession #:    [id number] Date of Birth: 1976/01/11           Patient Gender: F Patient Age:   15 years Exam Location:  [facility] Procedure:      VAS US  TRANSCRANIAL DOPPLER Referring Phys: ROCKEY CABBELL --------------------------------------------------------------------------------  Indications: Subarachnoid hemorrhage. Limitations: patient movement Comparison Study: Previous exam was on 07/09/2024 Performing Technologist: Ezzie Potters RVT, RDMS  Examination Guidelines: A complete evaluation includes B-mode imaging, spectral Doppler, color Doppler, and power Doppler as needed of all accessible portions of each vessel. Bilateral testing is considered an integral part of a complete examination. Limited examinations for reoccurring indications may be performed as noted.  +----------+---------------+----------+-----------+------------------+ RIGHT TCD Right VM (cm/s)Depth (cm)Pulsatility     Comment       +----------+---------------+----------+-----------+------------------+ MCA             51                    0.96                       +----------+---------------+----------+-----------+------------------+ ACA                                           unable to insonate +----------+---------------+----------+-----------+------------------+ Term ICA        45                    0.99                        +----------+---------------+----------+-----------+------------------+ PCA P1          22                    1.06                       +----------+---------------+----------+-----------+------------------+ Opthalmic  24                    1.10                       +----------+---------------+----------+-----------+------------------+ ICA siphon      50                    1.04                       +----------+---------------+----------+-----------+------------------+ Vertebral       -51                   0.98                       +----------+---------------+----------+-----------+------------------+ Distal ICA      27                    1.02                       +----------+---------------+----------+-----------+------------------+  +----------+--------------+----------+-----------+------------------+ LEFT TCD  Left VM (cm/s)Depth (cm)Pulsatility     Comment       +----------+--------------+----------+-----------+------------------+ MCA             50                   0.94                       +----------+--------------+----------+-----------+------------------+ ACA                                          unable to insonate +----------+--------------+----------+-----------+------------------+ Term ICA                                     unable to insonate +----------+--------------+----------+-----------+------------------+ PCA P1          24                   0.88                       +----------+--------------+----------+-----------+------------------+ Opthalmic       25                   0.96                       +----------+--------------+----------+-----------+------------------+ ICA siphon      32                   0.92                       +----------+--------------+----------+-----------+------------------+ Vertebral      -26                   0.90                        +----------+--------------+----------+-----------+------------------+ Distal ICA      24                   0.83                       +----------+--------------+----------+-----------+------------------+  +------------+-------+------------------+  VM cm/s     Comment       +------------+-------+------------------+ Prox Basilar  -47         0.99        +------------+-------+------------------+ Dist Basilar       unable to insonate +------------+-------+------------------+ +----------------------+----+ Right Lindegaard Ratio1.89 +----------------------+----+ +---------------------+----+ Left Lindegaard Ratio2.08 +---------------------+----+  Summary: This was a normal transcranial Doppler study, with normal flow direction and velocity of all identified vessels of the anterior and posterior circulations, with no evidence of stenosis, vasospasm or occlusion. There was no evidence of intracranial disease. *See table(s) above for TCD measurements and observations.  Diagnosing physician: Eather Popp MD Electronically signed by Eather Popp MD on 07/12/2024 at 10:36:15 AM.    Final    IR ANGIO VERTEBRAL SEL VERTEBRAL BILAT MOD SED PROCEDURE: Diagnostic Cerebral Angiogram  SURGEON:  Dr. Gerldine Maizes, MD  HISTORY:  The patient is a 49 y.o. yo female Initially presenting to the hospital with onset of headache.  Her initial CT scan demonstrated primarily prepontine subarachnoid hemorrhage with CT angiogram negative for intracranial aneurysm or arteriovenous malformation.  She has been monitored in the intensive care unit without change in neurologic condition and presents today for confirmatory diagnostic cerebral angiogram.  APPROACH:  The technical aspects of the procedure as well as its potential risks and benefits were reviewed with the patient. These risks included but were not limited bleeding, infection, allergic reaction, damage to organs/vital structures, stroke,  non-diagnostic procedure, and the catastrophic outcomes of heart attack, coma, and death. With an understanding of these risks, informed consent was obtained and witnessed.   The patient was placed in the supine position on the angiography table and the skin of right wrist and groin prepped in the usual sterile fashion. The procedure was performed under local anesthesia (1%-solution of bicarbonate-bufferred Lidoacaine) and conscious sedation with Versed  and fentanyl  monitored by the in-suite nurse and myself, including non-invasive blood pressure and continuous pulse oxymetry.   Accessed the right common femoral artery was obtained using standard anatomic landmarks and a micropuncture needle.  A short five French sheath was placed using standard Seldinger technique.  HEPARIN : 2000 Units total.   CONTRAST AGENT: See IR records  FLUOROSCOPY TIME: See IR records   CATHETER(S) AND WIRE(S):   5-French JB-1 glidecatheter  0.035 glidewire   VESSELS CATHETERIZED:  Right internal carotid  Left internal carotid  Right vertebral  Left vertebral  Right common femoral  VESSELS STUDIED:  Right internal carotid, head Left internal carotid, head Left vertebral Right vertebral Right femoral  PROCEDURAL NARRATIVE:  A 5-Fr JB-1 terumo glide catheter was advanced over a 0.035 glidewire into the aortic arch. The above vessels were then sequentially catheterized and cervical/cerebral angiograms taken. After review of images, the catheter was removed without incident.   INTERPRETATION:  Right internal carotid, head:  Injection reveals the presence of a widely patent ICA, M1, and A1 segments and their branches. No aneurysms, arteriovenous malformations, or high flow fistulas are visualized.  There is no vasospasm of the right carotid circulation. The parenchymal and venous phases are unremarkable. The venous sinuses are widely patent.   Left internal carotid, head:  Injection reveals the presence of a widely patent ICA, A1, and M1  segments and their branches. No aneurysms, arteriovenous malformations, or high flow fistulas are visualized.  There is no vasospasm of the left carotid circulation. The parenchymal and venous phases are unremarkable. The venous sinuses are widely patent.   Left vertebral:  Injection reveals the  presence of a widely patent vertebral artery which is noted to arise directly from the aortic arch. This leads to a widely patent basilar artery that terminates in bilateral P1. The basilar apex is normal. No aneurysms, arteriovenous malformations, or high flow fistulas are visualized. The parenchymal and venous phases are normal. The venous sinuses are patent.   Right vertebral:   Normal vessel. No PICA aneurysm. See basilar description above.   Right femoral:   Normal vessel. No significant atherosclerotic disease. Arterial sheath in adequate position.  DISPOSITION: Upon completion of the study, the sheath was removed and hemostasis obtained using a 5-Fr Exoseal closure device. Good proximal and distal extremity pulses were documented upon achievement of hemostasis. The procedure was well tolerated and no early complications were observed.  The patient was transferred to the neuro ICU to be positioned flat in bed for 3 hours.   IMPRESSION: 1. Normal diagnostic cerebral angiogram, without identification of intracranial aneurysm, arteriovenous malformation, or hypo fistula.  There is no vasospasm.    Gerldine Maizes, MD [facility]  Electronically signed by Maizes Gerldine, MD at 07/12/2024 11:35 AM     Procedures   Medications Ordered in the ED  dexamethasone  (DECADRON ) injection 4 mg (4 mg Intravenous Given 07/12/24 2307)  sodium chloride  flush (NS) 0.9 % injection 3 mL (has no administration in time range)  promethazine  (PHENERGAN ) tablet 25 mg (has no administration in time range)  bisacodyl  (DULCOLAX) EC tablet 5 mg (has no administration in time range)  oseltamivir  (TAMIFLU )  capsule 75 mg (has no administration in time range)  guaiFENesin  (MUCINEX ) 12 hr tablet 600 mg (has no administration in time range)  benzonatate  (TESSALON ) capsule 200 mg (has no administration in time range)  morphine  (PF) 4 MG/ML injection 4 mg (has no administration in time range)  oxyCODONE  (Oxy IR/ROXICODONE ) immediate release tablet 5 mg (has no administration in time range)  diazepam  (VALIUM ) tablet 5 mg (has no administration in time range)  butalbital -acetaminophen -caffeine  (FIORICET ) 50-325-40 MG per tablet 2 tablet (has no administration in time range)  ketorolac  (TORADOL ) 30 MG/ML injection 30 mg (has no administration in time range)  polyethylene glycol (MIRALAX  / GLYCOLAX ) packet 17 g (has no administration in time range)  docusate sodium  (COLACE) capsule 100 mg (has no administration in time range)  ondansetron  (ZOFRAN -ODT) disintegrating tablet 4 mg (has no administration in time range)    Or  ondansetron  (ZOFRAN ) injection 4 mg (has no administration in time range)  acetaminophen  (TYLENOL ) tablet 650 mg (has no administration in time range)  HYDROmorphone  (DILAUDID ) injection 0.5 mg (0.5 mg Intravenous Given 07/12/24 1934)  ondansetron  (ZOFRAN ) injection 4 mg (4 mg Intravenous Given 07/12/24 1935)  HYDROmorphone  (DILAUDID ) injection 0.5 mg (0.5 mg Intravenous Given 07/12/24 2005)  metoCLOPramide  (REGLAN ) injection 10 mg (10 mg Intravenous Given 07/12/24 2005)  diphenhydrAMINE  (BENADRYL ) injection 12.5 mg (12.5 mg Intravenous Given 07/12/24 2005)  sodium chloride  0.9 % bolus 1,000 mL (0 mLs Intravenous Stopped 07/12/24 2307)  iohexol  (OMNIPAQUE ) 350 MG/ML injection 75 mL (75 mLs Intravenous Contrast Given 07/12/24 2042)  HYDROmorphone  (DILAUDID ) injection 0.5 mg (0.5 mg Intravenous Given 07/12/24 2307)  ondansetron  (ZOFRAN ) injection 4 mg (4 mg Intravenous Given 07/12/24 2307)    Clinical Course as of 07/13/24 0037  Thu Jul 12, 2024  2220 Discussed with Camie Pickle from  neurosurgery. They are reviewing the imaging.  [RP]  2305 Dr Arthea consulted for admission. [RP]    Clinical Course User Index [RP] Yolande Lamar BROCKS,  MD                                 Medical Decision Making Amount and/or Complexity of Data Reviewed Labs: ordered. Radiology: ordered.  Risk Prescription drug management. Decision regarding hospitalization.   Ariona Deschene is a 49 year old female with a history of recent subarachnoid hemorrhage presents emergency department with headache, neck pain, and back pain  Initial Ddx:  Subarachnoid hemorrhage, RCVS, meningitis, arachnoiditis, ICH, venous bleeding  MDM/Course:  Patient resents emergency department with persistent headache.  Also is having some neck pain and back pain.  This is in the setting of recent subarachnoid hemorrhage diagnosis.  Also was diagnosed with the flu.  She was discharged home after an angiogram yesterday did not show evidence of aneurysm.  On arrival appears very uncomfortable and is vomiting.  No focal neurologic deficits.  Does have some neck pain that I suspect is from meningeal irritation from her subarachnoid hemorrhage.  Low concern for infectious meningitis at this point. CT head and CT venogram with no acute findings.  With 2 recent angiograms that did not show acute findings feel RCVS is unlikely. Discussed with neurosurgery who recommended Decadron  and pain medication and admission to hospitalist.  Upon re-evaluation still having significant pain and was given multiple doses of Dilaudid  and antiemetics.  Admitted to hospitalist for further management  This patient presents to the ED for concern of complaints listed in HPI, this involves an extensive number of treatment options, and is a complaint that carries with it a high risk of complications and morbidity. Disposition including potential need for admission considered.   Dispo: Admit to Floor  I have reviewed the patients home  medications and made adjustments as needed Additional history obtained from spouse Records reviewed Outpatient Clinic Notes The following labs were independently interpreted: Chemistry and show no acute abnormality I independently reviewed the following imaging with scope of interpretation limited to determining acute life threatening conditions related to emergency care: CT Head and agree with the radiologist interpretation with the following exceptions: none I personally reviewed and interpreted cardiac monitoring: normal sinus rhythm  I personally reviewed and interpreted the pt's EKG: see above for interpretation  Consults: Hospitalist and Neurosurgery  Portions of this note were generated with Scientist, clinical (histocompatibility and immunogenetics). Dictation errors may occur despite best attempts at proofreading.     Final diagnoses:  Intractable headache, unspecified chronicity pattern, unspecified headache type  Intractable nausea and vomiting  Subarachnoid hemorrhage [facility])    ED Discharge Orders     None          Yolande Lamar BROCKS, MD 07/13/24 0038  "

## 2024-07-12 NOTE — Discharge Summary (Signed)
 " Physician Discharge Summary  Patient ID: Lori Bullock MRN: [id number] DOB/AGE: March 02, 1976 49 y.o.  Admit date: 07/03/2024 Discharge date: 07/12/2024  Admission Diagnoses:  Subarachnoid Hemorrhage  Discharge Diagnoses:  Same Principal Problem:   Subarachnoid hemorrhage [facility])   Discharged Condition: Stable  Hospital Course:  Lori Bullock is a 49 y.o. female Admitted to the hospital with sudden onset of headache and initial CT scan demonstrating primarily perimesencephalic hemorrhage.  CT angiogram initially was negative.  Patient was monitored in the neuro intensive care unit where she remained neurologically stable.  She had reassuring transcranial Dopplers.  She underwent diagnostic cerebral angiogram for confirmation approximately one week after her admission which was normal.  She therefore requested discharge home.  Treatments: Observation, diagnostic cerebral angiogram  Discharge Exam: Blood pressure 123/74, pulse 78, temperature 99.2 F (37.3 C), temperature source Oral, resp. rate 17, height 5' 6 (1.676 m), weight 60.2 kg, SpO2 96%. Awake, alert, oriented Speech fluent, appropriate CN grossly intact 5/5 BUE/BLE Wound c/d/i  Disposition: Discharge disposition: 01-Home or Self Care       Discharge Instructions     Call MD for:  redness, tenderness, or signs of infection (pain, swelling, redness, odor or green/yellow discharge around incision site)   Complete by: As directed    Call MD for:  temperature >100.4   Complete by: As directed    Discharge instructions   Complete by: As directed    Walk at home as much as possible, at least 4 times / day   Increase activity slowly   Complete by: As directed    Lifting restrictions   Complete by: As directed    No lifting > 10 lbs   May shower / Bathe   Complete by: As directed    48 hours after surgery   May walk up steps   Complete by: As directed    No dressing needed   Complete by: As directed     Other Restrictions   Complete by: As directed    No bending/twisting at waist      Allergies as of 07/11/2024       Reactions   Sulfonamide Derivatives Anaphylaxis        Medication List     STOP taking these medications    colestipol  1 g tablet Commonly known as: Colestid        TAKE these medications    estradiol 0.0375 mg/24hr patch Commonly known as: CLIMARA - Dosed in mg/24 hr Place 0.0375 mg onto the skin once a week.   levonorgestrel 20 MCG/DAY Iud Commonly known as: MIRENA 1 each by Intrauterine route once.   oseltamivir  75 MG capsule Commonly known as: TAMIFLU  Take 1 capsule (75 mg total) by mouth 2 (two) times daily for 4 days.   oxyCODONE  5 MG immediate release tablet Commonly known as: Oxy IR/ROXICODONE  Take 1 tablet (5 mg total) by mouth every 6 (six) hours as needed for up to 7 days for breakthrough pain.               Discharge Care Instructions  (From admission, onward)           Start     Ordered   07/11/24 0000  No dressing needed        07/11/24 1732            Follow-up Information     [name], MD Follow up in 3 week(s).   Specialty: Neurosurgery Contact information: [address] [place] [address] [phone]                 Signed: Gerldine JAYSON Maizes 07/12/2024, 11:36 AM   "

## 2024-07-12 NOTE — ED Triage Notes (Signed)
 Pt BIB [facility] from home for headache and feeling lethargic. Pt was d/last night after 8 day stay for a hemorrhagic stroke. Pt states that she does not feel right. Continues to have neck/head pain, nausea and vomiting, SOB and chills since yesterday and has gotten worse today. At EMS arrival, they stated she was gcs of 15, continuing care, pt became unresponsive, GCS of 4, BP drop from 150/70 to 90/50. But shortly became responsive again but was lethargic. Stroke score for EMS 0.   142/72 98% RA 58HR CBG 138 4mg  zofran  NS

## 2024-07-12 NOTE — Progress Notes (Signed)
 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  No new ICH or vascular findings. Ventricles stable in size from previous admission scan.  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  Dex 4 mg q6 may help symptomatically.  She can be admitted to stepdown for observation, HOB 30 degrees, taper dex over 2 weeks starting tomorrow.  If headaches persisting, can get repeat CT head tomorrow.

## 2024-07-13 ENCOUNTER — Other Ambulatory Visit (HOSPITAL_COMMUNITY): Payer: Self-pay

## 2024-07-13 DIAGNOSIS — I609 Nontraumatic subarachnoid hemorrhage, unspecified: Secondary | ICD-10-CM | POA: Diagnosis not present

## 2024-07-13 DIAGNOSIS — J101 Influenza due to other identified influenza virus with other respiratory manifestations: Secondary | ICD-10-CM

## 2024-07-13 DIAGNOSIS — G4489 Other headache syndrome: Secondary | ICD-10-CM

## 2024-07-13 LAB — BASIC METABOLIC PANEL WITH GFR
Anion gap: 10 (ref 5–15)
BUN: 8 mg/dL (ref 6–20)
CO2: 19 mmol/L — ABNORMAL LOW (ref 22–32)
Calcium: 8.7 mg/dL — ABNORMAL LOW (ref 8.9–10.3)
Chloride: 103 mmol/L (ref 98–111)
Creatinine, Ser: 0.68 mg/dL (ref 0.44–1.00)
GFR, Estimated: >60 mL/min
Glucose, Bld: 152 mg/dL — ABNORMAL HIGH (ref 70–99)
Potassium: 3.6 mmol/L (ref 3.5–5.1)
Sodium: 132 mmol/L — ABNORMAL LOW (ref 135–145)

## 2024-07-13 LAB — CBC
HCT: 34.5 % — ABNORMAL LOW (ref 36.0–46.0)
Hemoglobin: 12.2 g/dL (ref 12.0–15.0)
MCH: 32.2 pg (ref 26.0–34.0)
MCHC: 35.4 g/dL (ref 30.0–36.0)
MCV: 91 fL (ref 80.0–100.0)
Platelets: 262 K/uL (ref 150–400)
RBC: 3.79 MIL/uL — ABNORMAL LOW (ref 3.87–5.11)
RDW: 13.3 % (ref 11.5–15.5)
WBC: 6 K/uL (ref 4.0–10.5)
nRBC: 0 % (ref 0.0–0.2)

## 2024-07-13 LAB — MAGNESIUM: Magnesium: 1.8 mg/dL (ref 1.7–2.4)

## 2024-07-13 MED ORDER — PREDNISONE 10 MG PO TABS
ORAL_TABLET | ORAL | 0 refills | Status: AC
  Filled 2024-07-13: qty 21, 6d supply, fill #0

## 2024-07-13 MED ORDER — PROMETHAZINE HCL 25 MG PO TABS
25.0000 mg | ORAL_TABLET | Freq: Four times a day (QID) | ORAL | 0 refills | Status: AC | PRN
  Filled 2024-07-13: qty 30, 8d supply, fill #0

## 2024-07-13 MED ORDER — BUTALBITAL-APAP-CAFFEINE 50-325-40 MG PO TABS
2.0000 | ORAL_TABLET | Freq: Four times a day (QID) | ORAL | 0 refills | Status: AC | PRN
  Filled 2024-07-13: qty 14, 2d supply, fill #0

## 2024-07-13 MED ORDER — DIAZEPAM 5 MG PO TABS
5.0000 mg | ORAL_TABLET | Freq: Four times a day (QID) | ORAL | Status: DC | PRN
  Administered 2024-07-13: 5 mg via ORAL
  Filled 2024-07-13: qty 1

## 2024-07-13 NOTE — Progress Notes (Signed)
" °  °  Providing Compassionate, Quality Care - Together   NEUROSURGERY PROGRESS NOTE     S: Headaches improved. N/V improved.    O: EXAM:  BP 117/69 (BP Location: Left Arm)   Pulse 72   Temp 99 F (37.2 C) (Oral)   Resp 18   Ht 5' 6 (1.676 m)   Wt 72.6 kg   SpO2 98%   BMI 25.82 kg/m     Awake, alert, oriented  Speech fluent, appropriate  CNs grossly intact  MAEs   ASSESSMENT:  49 y.o. 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  -No further neurosurgical recommendations -Call w/ questions/concerns.   Camie Pickle, PAC  "

## 2024-07-13 NOTE — H&P (Incomplete)
" °  History and Physical    Patient: Lori Bullock FMW:[id number] DOB: 1975-09-11 DOA: 07/12/2024 DOS: the patient was seen and examined on 07/13/2024 PCP: Jacques Camie Pepper, PA-C  Patient coming from: {Point_of_Origin:26777}  Chief Complaint: No chief complaint on file.  HPI: Lori Bullock is a 49 y.o. female with medical history significant of ***  Review of Systems: {ROS_Text:26778} Past Medical History:  Diagnosis Date   Anxiety    Lymphocytic colitis    Jakie   Past Surgical History:  Procedure Laterality Date   ANKLE FRACTURE SURGERY     IR ANGIO INTRA EXTRACRAN SEL INTERNAL CAROTID BILAT MOD SED  07/11/2024   IR ANGIO VERTEBRAL SEL VERTEBRAL BILAT MOD SED  07/11/2024   Social History:  reports that she has never smoked. She has never used smokeless tobacco. She reports that she does not drink alcohol and does not use drugs.  Allergies[1]  Family History  Problem Relation Age of Onset   Arthritis Mother    Diabetes Mother    Hypertension Mother    Colon polyps Father    Diabetes Father        diet controlled   Hypertension Father    Cervical cancer Maternal Aunt    Prostate cancer Paternal Uncle    Lung cancer Paternal Grandmother    Colon cancer Neg Hx    Esophageal cancer Neg Hx    Stomach cancer Neg Hx    Rectal cancer Neg Hx     Prior to Admission medications  Medication Sig Start Date End Date Taking? Authorizing Provider  estradiol (CLIMARA - DOSED IN MG/24 HR) 0.0375 mg/24hr patch Place 0.0375 mg onto the skin once a week.    [provider]  levonorgestrel (MIRENA) 20 MCG/DAY IUD 1 each by Intrauterine route once.    [provider]  oseltamivir  (TAMIFLU ) 75 MG capsule Take 1 capsule (75 mg total) by mouth 2 (two) times daily for 4 days. 07/11/24 07/15/24  [name], MD  oxyCODONE  (OXY IR/ROXICODONE ) 5 MG immediate release tablet Take 1 tablet (5 mg total) by mouth every 6 (six) hours as needed  for up to 7 days for breakthrough pain. 07/11/24 07/18/24  [name], MD    Physical Exam: Vitals:   07/12/24 2215 07/12/24 2230 07/12/24 2319 07/12/24 2330  BP: 136/82 (!) 153/81  133/83  Pulse: 63 63  66  Resp: 15 14  10   Temp:   99 F (37.2 C)   TempSrc:   Oral   SpO2: 100% 99%  98%  Weight:      Height:       *** Data Reviewed: {Tip this will not be part of the note when signed- Document your independent interpretation of telemetry tracing, EKG, lab, Radiology test or any other diagnostic tests. Add any new diagnostic test ordered today. (Optional):26781} {Results:26384}  Assessment and Plan: No notes have been filed under this hospital service. Service: Hospitalist     Advance Care Planning:   Code Status: Full Code ***  Consults: ***  Family Communication: ***  Severity of Illness: {Observation/Inpatient:21159}  Author: ARTHEA CHILD, MD 07/13/2024 12:03 AM  For on call review [email or web address].       [1] Allergies Allergen Reactions   Sulfonamide Derivatives Anaphylaxis  "

## 2024-07-13 NOTE — Plan of Care (Signed)
   Problem: Activity: Goal: Risk for activity intolerance will decrease Outcome: Adequate for Discharge

## 2024-07-13 NOTE — TOC Transition Note (Signed)
 Transition of Care [facility]) - Discharge Note   Patient Details  Name: Lori Bullock MRN: [id number] Date of Birth: Jul 20, 1975  [facility] CM/SW Contact:  Andrez JULIANNA George, RN Phone Number: 07/13/2024, 1:54 PM   Clinical Narrative:     Pt is discharging home with self care. No needs per IP Care management. Pt has transportation home and support at home.  Final next level of care: Home/Self Care Barriers to Discharge: No Barriers Identified   Patient Goals and CMS Choice            Discharge Placement                       Discharge Plan and Services Additional resources added to the After Visit Summary for                                       Social Drivers of Health (SDOH) Interventions SDOH Screenings   Food Insecurity: No Food Insecurity (07/13/2024)  Housing: Low Risk (07/13/2024)  Transportation Needs: No Transportation Needs (07/13/2024)  Utilities: Not At Risk (07/04/2024)  Financial Resource Strain: Low Risk (04/18/2024)   Received from [facility]  Physical Activity: Sufficiently Active (04/18/2024)   Received from [facility]  Social Connections: Socially Integrated (04/18/2024)   Received from [facility]  Stress: No Stress Concern Present (04/18/2024)   Received from [facility]  Tobacco Use: Low Risk (07/03/2024)     Readmission Risk Interventions     No data to display

## 2024-07-13 NOTE — H&P (Addendum)
 " History and Physical    Patient: Lori Bullock FMW:[id number] DOB: 30-Jul-1975 DOA: 07/12/2024 DOS: the patient was seen and examined on 07/13/2024 PCP: Jacques Camie Pepper, PA-C  Patient coming from: Home  Chief Complaint: No chief complaint on file.  HPI: Lori Bullock is a 49 y.o. female with medical history significant for spontaneous subarachnoid hemorrhage 10 days ago.  The patient was hospitalized for this until last night at 1030pm when she demanded to go home.  The patient had a cerebral angiogram earlier prior to going home which was normal.  The patient had lots of trouble with severe headache and lower neck pain while hospitalized.  She her hospital stay was complicated by the fact that she was diagnosed with influenza A and rhinovirus.  She was started on Tamiflu  her fevers resolved.  She just continued to feel horrible and thought that she may feel better at home.  At home the patient had the help and support of her husband.  She was able to get up and walk to the bathroom and take walks in the house but she continued to have really severe headaches.  She was getting NSAIDs alternating with Tylenol  alternating with oxycodone .  She also had Zofran  alternating with Phenergan  but despite all of these medications she continued to have spasms severe pain.  When she gets those spasms she vomits and says that her legs feel numb for short time.  It is these intermittent shooting severe pains that are unbearable.  Her husband brought her back to the hospital because of these episodes of severe pain that caused her whole body to shake she gets weak her pain is beyond 10 and she usually vomits.  They just were not able to manage her level of symptoms at home with the meds they had.  The husband was also worried that her symptoms might even be getting worse.  She did occasionally have numbness in her lower extremities or when the pain becomes very severe.  And seem like that was lasting for  longer.  He was worried about her falling. Neurosurgery was called in the emergency department.  They requested that the hospitalist team admit the patient and they would follow.  He did recommend starting Decadron  every 6.   Review of Systems: As mentioned in the history of present illness. All other systems reviewed and are negative. Past Medical History:  Diagnosis Date   Anxiety    Lymphocytic colitis    Jakie   Past Surgical History:  Procedure Laterality Date   ANKLE FRACTURE SURGERY     IR ANGIO INTRA EXTRACRAN SEL INTERNAL CAROTID BILAT MOD SED  07/11/2024   IR ANGIO VERTEBRAL SEL VERTEBRAL BILAT MOD SED  07/11/2024   Social History:  reports that she has never smoked. She has never used smokeless tobacco. She reports that she does not drink alcohol and does not use drugs.  Allergies[1]  Family History  Problem Relation Age of Onset   Arthritis Mother    Diabetes Mother    Hypertension Mother    Colon polyps Father    Diabetes Father        diet controlled   Hypertension Father    Cervical cancer Maternal Aunt    Prostate cancer Paternal Uncle    Lung cancer Paternal Grandmother    Colon cancer Neg Hx    Esophageal cancer Neg Hx    Stomach cancer Neg Hx    Rectal cancer Neg Hx     Prior to  Admission medications  Medication Sig Start Date End Date Taking? Authorizing Provider  estradiol (CLIMARA - DOSED IN MG/24 HR) 0.0375 mg/24hr patch Place 0.0375 mg onto the skin once a week.    [provider]  levonorgestrel (MIRENA) 20 MCG/DAY IUD 1 each by Intrauterine route once.    [provider]  oseltamivir  (TAMIFLU ) 75 MG capsule Take 1 capsule (75 mg total) by mouth 2 (two) times daily for 4 days. 07/11/24 07/15/24  [name], MD  oxyCODONE  (OXY IR/ROXICODONE ) 5 MG immediate release tablet Take 1 tablet (5 mg total) by mouth every 6 (six) hours as needed for up to 7 days for breakthrough pain. 07/11/24 07/18/24  [name], MD     Physical Exam: Vitals:   07/12/24 2215 07/12/24 2230 07/12/24 2319 07/12/24 2330  BP: 136/82 (!) 153/81  133/83  Pulse: 63 63  66  Resp: 15 14  10   Temp:   99 F (37.2 C)   TempSrc:   Oral   SpO2: 100% 99%  98%  Weight:      Height:       Physical Exam:  General: well developed, well nourished, She has a wash cloth covering her eyes and forehead, HEENT: Normocephalic, atraumatic, PERRL Cardiovascular: Normal rhythm. Distal pulses intact. Right groin is still bandaged from her angiogram yesterday/  Pulmonary: Normal pulmonary effort, normal breath sounds Gastrointestinal: Nondistended abdomen, soft, non-tender, normoactive bowel sounds Musculoskeletal:Normal ROM, no lower ext edema Skin: Skin is warm and dry. Neuro: strength exam deferred, AAOx3. PSYCH: Attentive and cooperative  Data Reviewed:  Results for orders placed or performed during the hospital encounter of 07/12/24 (from the past 24 hours)  CBC with Differential     Status: None   Collection Time: 07/12/24  7:26 PM  Result Value Ref Range   WBC 8.4 4.0 - 10.5 K/uL   RBC 4.15 3.87 - 5.11 MIL/uL   Hemoglobin 13.2 12.0 - 15.0 g/dL   HCT 61.6 63.9 - 53.9 %   MCV 92.3 80.0 - 100.0 fL   MCH 31.8 26.0 - 34.0 pg   MCHC 34.5 30.0 - 36.0 g/dL   RDW 86.6 88.4 - 84.4 %   Platelets 306 150 - 400 K/uL   nRBC 0.0 0.0 - 0.2 %   Neutrophils Relative % 77 %   Neutro Abs 6.5 1.7 - 7.7 K/uL   Lymphocytes Relative 13 %   Lymphs Abs 1.1 0.7 - 4.0 K/uL   Monocytes Relative 8 %   Monocytes Absolute 0.6 0.1 - 1.0 K/uL   Eosinophils Relative 1 %   Eosinophils Absolute 0.1 0.0 - 0.5 K/uL   Basophils Relative 0 %   Basophils Absolute 0.0 0.0 - 0.1 K/uL   Immature Granulocytes 1 %   Abs Immature Granulocytes 0.04 0.00 - 0.07 K/uL  Comprehensive metabolic panel     Status: Abnormal   Collection Time: 07/12/24  7:26 PM  Result Value Ref Range   Sodium 135 135 - 145 mmol/L   Potassium 3.8 3.5 - 5.1 mmol/L   Chloride 101 98 -  111 mmol/L   CO2 20 (L) 22 - 32 mmol/L   Glucose, Bld 125 (H) 70 - 99 mg/dL   BUN 10 6 - 20 mg/dL   Creatinine, Ser 9.17 0.44 - 1.00 mg/dL   Calcium 9.0 8.9 - 89.6 mg/dL   Total Protein 6.9 6.5 - 8.1 g/dL   Albumin 4.0 3.5 - 5.0 g/dL   AST 32 15 - 41 U/L  ALT 22 0 - 44 U/L   Alkaline Phosphatase 88 38 - 126 U/L   Total Bilirubin 0.3 0.0 - 1.2 mg/dL   GFR, Estimated >39 >39 mL/min   Anion gap 14 5 - 15  hCG, serum, qualitative     Status: None   Collection Time: 07/12/24  7:26 PM  Result Value Ref Range   Preg, Serum NEGATIVE NEGATIVE  I-stat chem 8, ED (not at [facility], [facility] or [facility])     Status: Abnormal   Collection Time: 07/12/24  7:46 PM  Result Value Ref Range   Sodium 136 135 - 145 mmol/L   Potassium 3.7 3.5 - 5.1 mmol/L   Chloride 102 98 - 111 mmol/L   BUN 10 6 - 20 mg/dL   Creatinine, Ser 9.19 0.44 - 1.00 mg/dL   Glucose, Bld 875 (H) 70 - 99 mg/dL   Calcium, Ion 8.85 (L) 1.15 - 1.40 mmol/L   TCO2 20 (L) 22 - 32 mmol/L   Hemoglobin 13.3 12.0 - 15.0 g/dL   HCT 60.9 63.9 - 53.9 %     Assessment and Plan: SAH with persistent severe h/a, vomiting, and back pain -appreciate neurosurgery recommendations - Decadron  every 6 - Keep HOB>30 degrees at all times - Scheduled Tylenol  - As needed IV narcotics.  The patient says she does not love the Dilaudid  so we will try IV morphine  x 1 instead - symptom management 2.  Influenza A and rhinovirus -her positive test was January 12 which was 3 days ago. - Continue Tamiflu    Advance Care Planning:   Code Status: Full Code  CODE STATUS discussion was deferred.  She will be full code by default at this time. Consults: neurosurgery  Family Communication: husband at bedside  Severity of Illness: The appropriate patient status for this patient is INPATIENT. Inpatient status is judged to be reasonable and necessary in order to provide the required intensity of service to ensure the patient's safety. The patient's presenting symptoms,  physical exam findings, and initial radiographic and laboratory data in the context of their chronic comorbidities is felt to place them at high risk for further clinical deterioration. Furthermore, it is not anticipated that the patient will be medically stable for discharge from the hospital within 2 midnights of admission.   * I certify that at the point of admission it is my clinical judgment that the patient will require inpatient hospital care spanning beyond 2 midnights from the point of admission due to high intensity of service, high risk for further deterioration and high frequency of surveillance required.*  Author: ARTHEA CHILD, MD 07/13/2024 12:03 AM  For on call review [email or web address].      [1]  Allergies Allergen Reactions   Sulfonamide Derivatives Anaphylaxis   "

## 2024-07-13 NOTE — Progress Notes (Signed)
 " PROGRESS NOTE    Lori Bullock  FMW:[id number] DOB: [birth year] DOA: 07/12/2024 PCP: Jacques Camie Pepper, PA-C   Brief Narrative:  HPI: Lori Bullock is a 49 y.o. female with medical history significant for spontaneous subarachnoid hemorrhage 10 days ago.  The patient was hospitalized for this until last night at 1030pm when she demanded to go home.  The patient had a cerebral angiogram earlier prior to going home which was normal.  The patient had lots of trouble with severe headache and lower neck pain while hospitalized.  She her hospital stay was complicated by the fact that she was diagnosed with influenza A and rhinovirus.  She was started on Tamiflu  her fevers resolved.  She just continued to feel horrible and thought that she may feel better at home.  At home the patient had the help and support of her husband.  She was able to get up and walk to the bathroom and take walks in the house but she continued to have really severe headaches.  She was getting NSAIDs alternating with Tylenol  alternating with oxycodone .  She also had Zofran  alternating with Phenergan  but despite all of these medications she continued to have spasms severe pain.  When she gets those spasms she vomits and says that her legs feel numb for short time.  It is these intermittent shooting severe pains that are unbearable.  Her husband brought her back to the hospital because of these episodes of severe pain that caused her whole body to shake she gets weak her pain is beyond 10 and she usually vomits.  They just were not able to manage her level of symptoms at home with the meds they had.  The husband was also worried that her symptoms might even be getting worse.  She did occasionally have numbness in her lower extremities or when the pain becomes very severe.  And seem like that was lasting for longer.  He was worried about her falling. Neurosurgery was called in the emergency department.  They requested that the  hospitalist team admit the patient and they would follow.  He did recommend starting Decadron  every 6.    Assessment & Plan:   Principal Problem:   SAH (subarachnoid hemorrhage) (HCC) Active Problems:   Headache  SAH with persistent severe h/a, vomiting, and back pain -neurosurgery on board, patient started on Decadron  per their recommendations.  Patient still complains of headache and low back pain.  I have discussed with neurosurgery/ Camie, patient does not have any medical issues to be managed by hospitalist service.  Neurosurgery agreed to assume care of this patient under their service.  Hospitalist signing off. 2.  Influenza A and rhinovirus -her positive test was January 12 which was 3 days ago. - Continue Tamiflu   DVT prophylaxis: SCDs Start: 07/12/24 2342   Code Status: Full Code  Family Communication: Husband present at bedside.  Plan of care discussed with patient in length and he/she verbalized understanding and agreed with it.  Status is: Inpatient Remains inpatient appropriate because: Defer to neurosurgery.   Estimated body mass index is 25.82 kg/m as calculated from the following:   Height as of this encounter: 5' 6 (1.676 m).   Weight as of this encounter: 72.6 kg.    Nutritional Assessment: Body mass index is 25.82 kg/m.SABRA Seen by dietician.  I agree with the assessment and plan as outlined below: Nutrition Status:        . Skin Assessment: I have examined the patient's skin and  I agree with the wound assessment as performed by the wound care RN as outlined below:    Consultants:  Neurosurgery  Procedures:  As above  Antimicrobials:  Anti-infectives (From admission, onward)    Start     Dose/Rate Route Frequency Ordered Stop   07/13/24 0000  oseltamivir  (TAMIFLU ) capsule 75 mg        75 mg Oral 2 times daily 07/12/24 2347 07/15/24 2159         Subjective: Patient seen and examined earlier.  She was complaining of headache but improved  compared to yesterday, down to 6 out of 10 where it was 8 out of 10 yesterday.  She was also complaining of some low back pain.  Denied any problem with vision or speech.  Husband at the bedside.  Patient was also feeling nauseous.  RN was at the bedside about to give her Zofran .  Objective: Vitals:   07/13/24 0222 07/13/24 0507 07/13/24 0937 07/13/24 1232  BP: 127/79 130/79 117/69 127/79  Pulse: 60 64 72 67  Resp: 18 16 18 18   Temp: 97.8 F (36.6 C) 98.7 F (37.1 C) 99 F (37.2 C) 98.9 F (37.2 C)  TempSrc: Oral Oral Oral Oral  SpO2: 96% 100% 98% 99%  Weight:      Height:       No intake or output data in the 24 hours ending 07/13/24 1318 Filed Weights   07/12/24 1925  Weight: 72.6 kg    Examination:  General exam: Appears in pain. Respiratory system: Clear to auscultation. Respiratory effort normal. Cardiovascular system: S1 & S2 heard, RRR. No JVD, murmurs, rubs, gallops or clicks. No pedal edema. Gastrointestinal system: Abdomen is nondistended, soft and nontender. No organomegaly or masses felt. Normal bowel sounds heard. Central nervous system: Alert and oriented. No focal neurological deficits. Extremities: Symmetric 5 x 5 power. Skin: No rashes, lesions or ulcers Psychiatry: Judgement and insight appear normal. Mood & affect appropriate.    Data Reviewed: I have personally reviewed following labs and imaging studies  CBC: Recent Labs  Lab 07/07/24 0328 07/12/24 1926 07/12/24 1946 07/13/24 0226  WBC 8.7 8.4  --  6.0  NEUTROABS  --  6.5  --   --   HGB 12.4 13.2 13.3 12.2  HCT 35.6* 38.3 39.0 34.5*  MCV 91.5 92.3  --  91.0  PLT 259 306  --  262   Basic Metabolic Panel: Recent Labs  Lab 07/07/24 0328 07/07/24 1528 07/09/24 0433 07/10/24 0603 07/12/24 1926 07/12/24 1946 07/13/24 0226  NA 134*   < > 137  137 135 135 136 132*  K 4.0  --  3.9 3.2* 3.8 3.7 3.6  CL 100  --  106 104 101 102 103  CO2 26  --  20* 22 20*  --  19*  GLUCOSE 107*  --  82 93  125* 124* 152*  BUN 9  --  6 6 10 10 8   CREATININE 0.70  --  0.80 0.73 0.82 0.80 0.68  CALCIUM 8.7*  --  8.5* 8.7* 9.0  --  8.7*  MG  --   --   --   --   --   --  1.8   < > = values in this interval not displayed.   GFR: Estimated Creatinine Clearance: 88.7 mL/min (by C-G formula based on SCr of 0.68 mg/dL). Liver Function Tests: Recent Labs  Lab 07/12/24 1926  AST 32  ALT 22  ALKPHOS 88  BILITOT 0.3  PROT 6.9  ALBUMIN 4.0   No results for input(s): LIPASE, AMYLASE in the last 168 hours. No results for input(s): AMMONIA in the last 168 hours. Coagulation Profile: No results for input(s): INR, PROTIME in the last 168 hours. Cardiac Enzymes: No results for input(s): CKTOTAL, CKMB, CKMBINDEX, TROPONINI in the last 168 hours. BNP (last 3 results) No results for input(s): PROBNP in the last 8760 hours. HbA1C: No results for input(s): HGBA1C in the last 72 hours. CBG: No results for input(s): GLUCAP in the last 168 hours. Lipid Profile: No results for input(s): CHOL, HDL, LDLCALC, TRIG, CHOLHDL, LDLDIRECT in the last 72 hours. Thyroid Function Tests: No results for input(s): TSH, T4TOTAL, FREET4, T3FREE, THYROIDAB in the last 72 hours. Anemia Panel: No results for input(s): VITAMINB12, FOLATE, FERRITIN, TIBC, IRON, RETICCTPCT in the last 72 hours. Sepsis Labs: No results for input(s): PROCALCITON, LATICACIDVEN in the last 168 hours.  Recent Results (from the past 240 hours)  MRSA Next Gen by PCR, Nasal     Status: None   Collection Time: 07/03/24  6:25 PM   Specimen: Nasal Mucosa; Nasal Swab  Result Value Ref Range Status   MRSA by PCR Next Gen NOT DETECTED NOT DETECTED Final    Comment: (NOTE) The GeneXpert MRSA Assay (FDA approved for NASAL specimens only), is one component of a comprehensive MRSA colonization surveillance program. It is not intended to diagnose MRSA infection nor to guide or monitor  treatment for MRSA infections. Test performance is not FDA approved in patients less than 91 years old. Performed at [facility], [address], [place], [place] [address]   Resp panel by RT-PCR (RSV, Flu A&B, Covid) Anterior Nasal Swab     Status: None   Collection Time: 07/05/24 10:52 AM   Specimen: Anterior Nasal Swab  Result Value Ref Range Status   SARS Coronavirus 2 by RT PCR NEGATIVE NEGATIVE Final   Influenza A by PCR NEGATIVE NEGATIVE Final   Influenza B by PCR NEGATIVE NEGATIVE Final    Comment: (NOTE) The Xpert Xpress SARS-CoV-2/FLU/RSV plus assay is intended as an aid in the diagnosis of influenza from Nasopharyngeal swab specimens and should not be used as a sole basis for treatment. Nasal washings and aspirates are unacceptable for Xpert Xpress SARS-CoV-2/FLU/RSV testing.  Fact Sheet for Patients: bloggercourse.com  Fact Sheet for Healthcare Providers: seriousbroker.it  This test is not yet approved or cleared by the [place]  FDA and has been authorized for detection and/or diagnosis of SARS-CoV-2 by FDA under an Emergency Use Authorization (EUA). This EUA will remain in effect (meaning this test can be used) for the duration of the COVID-19 declaration under Section 564(b)(1) of the Act, 21 U.S.C. section 360bbb-3(b)(1), unless the authorization is terminated or revoked.     Resp Syncytial Virus by PCR NEGATIVE NEGATIVE Final    Comment: (NOTE) Fact Sheet for Patients: bloggercourse.com  Fact Sheet for Healthcare Providers: seriousbroker.it  This test is not yet approved or cleared by the [place]  FDA and has been authorized for detection and/or diagnosis of SARS-CoV-2 by FDA under an Emergency Use Authorization (EUA). This EUA will remain in effect (meaning this test can be used) for the duration of the COVID-19 declaration under Section  564(b)(1) of the Act, 21 U.S.C. section 360bbb-3(b)(1), unless the authorization is terminated or revoked.  Performed at [facility], [address], [place], [place] [address]   Respiratory (~20 pathogens) panel by PCR     Status: Abnormal  Collection Time: 07/09/24  6:49 PM   Specimen: Nasopharyngeal Swab; Respiratory  Result Value Ref Range Status   Adenovirus NOT DETECTED NOT DETECTED Final   Coronavirus 229E NOT DETECTED NOT DETECTED Final    Comment: (NOTE) The Coronavirus on the Respiratory Panel, DOES NOT test for the novel  Coronavirus (2019 nCoV)    Coronavirus HKU1 NOT DETECTED NOT DETECTED Final   Coronavirus NL63 NOT DETECTED NOT DETECTED Final   Coronavirus OC43 NOT DETECTED NOT DETECTED Final   Metapneumovirus NOT DETECTED NOT DETECTED Final   Rhinovirus / Enterovirus DETECTED (A) NOT DETECTED Final   Influenza A H1 2009 DETECTED (A) NOT DETECTED Final   Influenza B NOT DETECTED NOT DETECTED Final   Parainfluenza Virus 1 NOT DETECTED NOT DETECTED Final   Parainfluenza Virus 2 NOT DETECTED NOT DETECTED Final   Parainfluenza Virus 3 NOT DETECTED NOT DETECTED Final   Parainfluenza Virus 4 NOT DETECTED NOT DETECTED Final   Respiratory Syncytial Virus NOT DETECTED NOT DETECTED Final   Bordetella pertussis NOT DETECTED NOT DETECTED Final   Bordetella Parapertussis NOT DETECTED NOT DETECTED Final   Chlamydophila pneumoniae NOT DETECTED NOT DETECTED Final   Mycoplasma pneumoniae NOT DETECTED NOT DETECTED Final    Comment: Performed at [facility], [address], [place], [place] [address]     Radiology Studies: CT VENOGRAM HEAD Result Date: 07/12/2024 CLINICAL DATA:  Initial evaluation for acute headache, recent ICH. EXAM: CT VENOGRAM HEAD TECHNIQUE: Venographic phase images of the brain were obtained following the administration of intravenous contrast. Multiplanar reformats and maximum intensity projections were generated. Multiplanar CT image  reconstructions and MIPs were obtained to evaluate the vascular anatomy. RADIATION DOSE REDUCTION: This exam was performed according to the departmental dose-optimization program which includes automated exposure control, adjustment of the mA and/or kV according to patient size and/or use of iterative reconstruction technique. CONTRAST:  75mL OMNIPAQUE  IOHEXOL  350 MG/ML SOLN COMPARISON:  Comparison made with head CT performed at the same time as well as previous exams. FINDINGS: Normal enhancement seen throughout the superior sagittal sinus to the torcula. Transverse and sigmoid sinuses are patent as are the jugular bulbs and visualized proximal internal jugular veins. Small filling defect at the distal left transverse sinus noted, likely an arachnoid granulation (series 6, image 23). Straight sinus, vein of Galen and internal cerebral veins are patent. No evidence for dural venous sinus thrombosis. No dural venous sinus stenosis. No appreciable cortical vein abnormality. No abnormality about the cavernous sinus. IMPRESSION: Negative CT venogram. No evidence for dural venous sinus thrombosis. Electronically Signed   By: Morene Hoard M.D.   On: 07/12/2024 21:54   CT Head Wo Contrast Result Date: 07/12/2024 CLINICAL DATA:  Initial evaluation for acute headache, recent ICH. EXAM: CT HEAD WITHOUT CONTRAST TECHNIQUE: Contiguous axial images were obtained from the base of the skull through the vertex without intravenous contrast. RADIATION DOSE REDUCTION: This exam was performed according to the departmental dose-optimization program which includes automated exposure control, adjustment of the mA and/or kV according to patient size and/or use of iterative reconstruction technique. COMPARISON:  Prior CT from 07/07/2024 as well as earlier studies. FINDINGS: Brain: Cerebral volume within normal limits. Patchy hypodensity involving the supratentorial cerebral white matter, most commonly related to chronic  microvascular ischemic disease. Previously seen intracranial hemorrhage has resolved, with no acute hemorrhage now seen. No acute large vessel territory infarct. No mass lesion, midline shift or mass effect. No hydrocephalus or extra-axial fluid collection. 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 Vascular: No abnormal hyperdense vessel. Skull: Scalp soft tissues within normal limits.  Calvarium intact. Sinuses/Orbits: Globes orbital soft tissues within normal limits. Scattered mucosal thickening about the sphenoid ethmoidal and maxillary sinuses. No mastoid effusion. Other: None. IMPRESSION: 1. No acute intracranial abnormality. Previously seen hemorrhage has resolved, with no intracranial hemorrhage now seen. 2. Patchy hypodensity involving the supratentorial cerebral white matter, nonspecific, most commonly related to chronic microvascular ischemic disease. Electronically Signed   By: Morene Hoard M.D.   On: 07/12/2024 21:50   IR ANGIO VERTEBRAL SEL VERTEBRAL BILAT MOD SED PROCEDURE: Diagnostic Cerebral Angiogram  SURGEON:  Dr. Gerldine Maizes, MD  HISTORY:  The patient is a 49 y.o. yo female Initially presenting to the hospital with onset of headache.  Her initial CT scan demonstrated primarily prepontine subarachnoid hemorrhage with CT angiogram negative for intracranial aneurysm or arteriovenous malformation.  She has been monitored in the intensive care unit without change in neurologic condition and presents today for confirmatory diagnostic cerebral angiogram.  APPROACH:  The technical aspects of the procedure as well as its potential risks and benefits were reviewed with the patient. These risks included but were not limited bleeding, infection, allergic reaction, damage to organs/vital structures, stroke, non-diagnostic procedure, and the catastrophic outcomes of heart attack, coma, and death. With an understanding of these risks, informed consent was obtained and witnessed.    The patient was placed in the supine position on the angiography table and the skin of right wrist and groin prepped in the usual sterile fashion. The procedure was performed under local anesthesia (1%-solution of bicarbonate-bufferred Lidoacaine) and conscious sedation with Versed  and fentanyl  monitored by the in-suite nurse and myself, including non-invasive blood pressure and continuous pulse oxymetry.   Accessed the right common femoral artery was obtained using standard anatomic landmarks and a micropuncture needle.  A short five French sheath was placed using standard Seldinger technique.  HEPARIN : 2000 Units total.   CONTRAST AGENT: See IR records  FLUOROSCOPY TIME: See IR records   CATHETER(S) AND WIRE(S):   5-French JB-1 glidecatheter  0.035 glidewire   VESSELS CATHETERIZED:  Right internal carotid  Left internal carotid  Right vertebral  Left vertebral  Right common femoral  VESSELS STUDIED:  Right internal carotid, head Left internal carotid, head Left vertebral Right vertebral Right femoral  PROCEDURAL NARRATIVE:  A 5-Fr JB-1 terumo glide catheter was advanced over a 0.035 glidewire into the aortic arch. The above vessels were then sequentially catheterized and cervical/cerebral angiograms taken. After review of images, the catheter was removed without incident.   INTERPRETATION:  Right internal carotid, head:  Injection reveals the presence of a widely patent ICA, M1, and A1 segments and their branches. No aneurysms, arteriovenous malformations, or high flow fistulas are visualized.  There is no vasospasm of the right carotid circulation. The parenchymal and venous phases are unremarkable. The venous sinuses are widely patent.   Left internal carotid, head:  Injection reveals the presence of a widely patent ICA, A1, and M1 segments and their branches. No aneurysms, arteriovenous malformations, or high flow fistulas are visualized.  There is no vasospasm of the left carotid circulation. The parenchymal  and venous phases are unremarkable. The venous sinuses are widely patent.   Left vertebral:  Injection reveals the presence of a widely patent vertebral artery which is noted to arise directly from the aortic arch. This leads to a widely patent basilar artery that terminates in bilateral P1. The basilar apex is normal. No  aneurysms, arteriovenous malformations, or high flow fistulas are visualized. The parenchymal and venous phases are normal. The venous sinuses are patent.   Right vertebral:   Normal vessel. No PICA aneurysm. See basilar description above.   Right femoral:   Normal vessel. No significant atherosclerotic disease. Arterial sheath in adequate position.  DISPOSITION: Upon completion of the study, the sheath was removed and hemostasis obtained using a 5-Fr Exoseal closure device. Good proximal and distal extremity pulses were documented upon achievement of hemostasis. The procedure was well tolerated and no early complications were observed.  The patient was transferred to the neuro ICU to be positioned flat in bed for 3 hours.   IMPRESSION: 1. Normal diagnostic cerebral angiogram, without identification of intracranial aneurysm, arteriovenous malformation, or hypo fistula.  There is no vasospasm.    Gerldine Maizes, MD [facility]  Electronically signed by Maizes Gerldine, MD at 07/12/2024 11:35 AM    Scheduled Meds:  acetaminophen   650 mg Oral Q6H   dexamethasone  (DECADRON ) injection  4 mg Intravenous Q6H   docusate sodium   100 mg Oral BID   guaiFENesin   600 mg Oral BID   oseltamivir   75 mg Oral BID   polyethylene glycol  17 g Oral Daily   promethazine   25 mg Oral Q6H   sodium chloride  flush  3 mL Intravenous Q12H   Continuous Infusions:   LOS: 1 day   Fredia Skeeter, MD [facility]  07/13/2024, 1:18 PM   *Please note that this is a verbal dictation therefore any spelling or grammatical errors are due to the Dragon Medical One system  interpretation.  Please page via Amion and do not message via secure chat for urgent patient care matters. Secure chat can be used for non urgent patient care matters.  How to contact the [facility] Attending or Consulting provider 7A - 7P or covering provider during after hours 7P -7A, for this patient?  Check the care team in [facility] and look for a) attending/consulting [facility] provider listed and b) the [facility] team listed. Page or secure chat 7A-7P. Log into [email or web address] and use [facility] universal password to access. If you do not have the password, please contact the hospital operator. Locate the [facility] provider you are looking for under [facility] and page to a number that you can be directly reached. If you still have difficulty reaching the provider, please page the [facility] (Director on Call) for the Hospitalists listed on amion for assistance.  "

## 2024-07-13 NOTE — Discharge Summary (Cosign Needed Addendum)
 " Patient ID: Iysis Germain MRN: [id number] DOB/AGE: [birth year] 49 y.o.  Admit date: 07/12/2024 Discharge date: 07/13/2024  Admission Diagnoses: Subarachnoid hemorrhage (HCC) [I60.9] SAH (subarachnoid hemorrhage) (HCC) [I60.9] Intractable nausea and vomiting [R11.2] Intractable headache, unspecified chronicity pattern, unspecified headache type [R51.9]   Discharge Diagnoses: Same   Discharged Condition: Stable  Hospital Course:  Dulcie Gammon is a 49 y.o. female who was admitted to the hospital 07/03/24 with sudden onset of headache and initial CT scan demonstrating primarily perimesencephalic hemorrhage. CT angiogram initially was negative. Patient was monitored in the neuro intensive care unit where she remained neurologically stable. She had reassuring transcranial Dopplers. She underwent diagnostic cerebral angiogram for confirmation on 07/11/24 and was discharged to home after. She presented to the [facility] ED again on 1/15 for persistent HA and N/V. Also with Influenza A. Imaging negative for acute processes. HA and N/V better controlled now. She requests discharge home. She will be provided with rx for steroid taper and fu as scheduled in outpt clinic.    Discharge Exam: Blood pressure 127/79, pulse 67, temperature 98.9 F (37.2 C), temperature source Oral, resp. rate 18, height 5' 6 (1.676 m), weight 72.6 kg, SpO2 99%.   Disposition:  Discharge disposition: 01-Home or Self Care        Allergies as of 07/13/2024       Reactions   Sulfonamide Derivatives Anaphylaxis        Medication List     TAKE these medications    acetaminophen  500 MG tablet Commonly known as: TYLENOL  Take 1,000 mg by mouth every 8 (eight) hours as needed for headache or moderate pain (pain score 4-6).   butalbital -acetaminophen -caffeine  50-325-40 MG tablet Commonly known as: FIORICET  Take 2 tablets by mouth every 6 (six) hours as needed for headache.   estradiol 0.0375 mg/24hr  patch Commonly known as: CLIMARA - Dosed in mg/24 hr Place 0.0375 mg onto the skin once a week.   guaifenesin  100 MG/5ML syrup Commonly known as: ROBITUSSIN Take 400 mg by mouth 3 (three) times daily as needed for congestion or cough.   ibuprofen 200 MG tablet Commonly known as: ADVIL Take 200-400 mg by mouth every 8 (eight) hours as needed for headache or moderate pain (pain score 4-6).   levonorgestrel 20 MCG/DAY Iud Commonly known as: MIRENA 1 each by Intrauterine route once.   oseltamivir  75 MG capsule Commonly known as: TAMIFLU  Take 1 capsule (75 mg total) by mouth 2 (two) times daily for 4 days.   oxyCODONE  5 MG immediate release tablet Commonly known as: Oxy IR/ROXICODONE  Take 1 tablet (5 mg total) by mouth every 6 (six) hours as needed for up to 7 days for breakthrough pain.   predniSONE  10 MG tablet Commonly known as: DELTASONE  Take 6 tablets (60 mg total) by mouth daily for 1 day, THEN 5 tablets (50 mg total) daily for 1 day, THEN 4 tablets (40 mg total) daily for 1 day, THEN 3 tablets (30 mg total) daily for 1 day, THEN 2 tablets (20 mg total) daily for 1 day, THEN 1 tablet (10 mg total) daily for 1 day. Start taking on: July 13, 2024   promethazine  25 MG tablet Commonly known as: PHENERGAN  Take 1 tablet (25 mg total) by mouth every 6 (six) hours as needed for nausea or vomiting. What changed:  medication strength how much to take when to take this reasons to take this   Womens Multi Gummies Chew Chew 2 tablets by mouth daily. OLLI   ZOFRAN   ODT PO Take 1 tablet by mouth daily as needed (for nausea).         Signed: Demondre Aguas CAYLIN Jasmeen Fritsch 07/13/2024, 1:46 PM   "
